# Patient Record
Sex: Female | Born: 1940 | Race: White | Hispanic: No | Smoking: Former smoker
Health system: Southern US, Community
[De-identification: ages and names within clinical notes are randomized; demographics above are authoritative.]

## PROBLEM LIST (undated history)

## (undated) DIAGNOSIS — K219 Gastro-esophageal reflux disease without esophagitis: Secondary | ICD-10-CM

## (undated) DIAGNOSIS — F419 Anxiety disorder, unspecified: Secondary | ICD-10-CM

## (undated) DIAGNOSIS — E785 Hyperlipidemia, unspecified: Secondary | ICD-10-CM

## (undated) HISTORY — PX: OTHER SURGICAL HISTORY: SHX169

## (undated) HISTORY — DX: Hyperlipidemia, unspecified: E78.5

---
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
## 1969-06-18 HISTORY — PX: URETHRA SURGERY: SHX824

## 1984-06-18 HISTORY — PX: GANGLION CYST EXCISION: SHX1691

## 1988-06-18 HISTORY — PX: IRRIGATION AND DEBRIDEMENT SEBACEOUS CYST: SHX5255

## 2002-11-03 DIAGNOSIS — F4320 Adjustment disorder, unspecified: Secondary | ICD-10-CM | POA: Insufficient documentation

## 2002-11-03 DIAGNOSIS — F988 Other specified behavioral and emotional disorders with onset usually occurring in childhood and adolescence: Secondary | ICD-10-CM | POA: Insufficient documentation

## 2004-05-23 ENCOUNTER — Ambulatory Visit: Payer: Self-pay | Admitting: Family Medicine

## 2005-05-31 ENCOUNTER — Ambulatory Visit: Payer: Self-pay | Admitting: Family Medicine

## 2006-09-04 ENCOUNTER — Ambulatory Visit: Payer: Self-pay | Admitting: Family Medicine

## 2006-09-24 ENCOUNTER — Ambulatory Visit: Payer: Self-pay | Admitting: Unknown Physician Specialty

## 2007-09-11 ENCOUNTER — Ambulatory Visit: Payer: Self-pay | Admitting: Family Medicine

## 2008-11-12 DIAGNOSIS — E7800 Pure hypercholesterolemia, unspecified: Secondary | ICD-10-CM | POA: Insufficient documentation

## 2008-11-12 DIAGNOSIS — E785 Hyperlipidemia, unspecified: Secondary | ICD-10-CM | POA: Insufficient documentation

## 2008-11-12 DIAGNOSIS — K2100 Gastro-esophageal reflux disease with esophagitis, without bleeding: Secondary | ICD-10-CM | POA: Insufficient documentation

## 2008-11-12 DIAGNOSIS — Z720 Tobacco use: Secondary | ICD-10-CM | POA: Insufficient documentation

## 2009-01-26 ENCOUNTER — Ambulatory Visit: Payer: Self-pay | Admitting: Family Medicine

## 2009-05-02 ENCOUNTER — Ambulatory Visit: Payer: Self-pay | Admitting: Urology

## 2009-07-08 DIAGNOSIS — R42 Dizziness and giddiness: Secondary | ICD-10-CM | POA: Insufficient documentation

## 2010-05-02 ENCOUNTER — Ambulatory Visit: Payer: Self-pay | Admitting: Family Medicine

## 2011-04-23 LAB — HM PAP SMEAR: HM Pap smear: NEGATIVE

## 2011-06-27 ENCOUNTER — Ambulatory Visit: Payer: Self-pay | Admitting: Family Medicine

## 2012-03-06 ENCOUNTER — Ambulatory Visit: Payer: Self-pay | Admitting: Unknown Physician Specialty

## 2012-03-06 LAB — HM COLONOSCOPY

## 2013-08-10 LAB — CBC AND DIFFERENTIAL
HEMATOCRIT: 38 % (ref 36–46)
HEMOGLOBIN: 12.7 g/dL (ref 12.0–16.0)
Platelets: 259 10*3/uL (ref 150–399)
WBC: 5.5 10^3/mL

## 2013-08-10 LAB — HEPATIC FUNCTION PANEL
ALT: 19 U/L (ref 7–35)
AST: 21 U/L (ref 13–35)

## 2013-08-10 LAB — BASIC METABOLIC PANEL
BUN: 15 mg/dL (ref 4–21)
Creatinine: 0.8 mg/dL (ref 0.5–1.1)
Glucose: 94 mg/dL
Potassium: 4.3 mmol/L (ref 3.4–5.3)
SODIUM: 143 mmol/L (ref 137–147)

## 2013-08-10 LAB — TSH: TSH: 2.17 u[IU]/mL (ref 0.41–5.90)

## 2013-08-10 LAB — LIPID PANEL
Cholesterol: 259 mg/dL — AB (ref 0–200)
HDL: 63 mg/dL (ref 35–70)
LDL Cholesterol: 169 mg/dL
Triglycerides: 137 mg/dL (ref 40–160)

## 2013-08-25 ENCOUNTER — Ambulatory Visit: Payer: Self-pay | Admitting: Family Medicine

## 2013-08-26 ENCOUNTER — Ambulatory Visit: Payer: Self-pay | Admitting: Family Medicine

## 2013-08-26 LAB — HM DEXA SCAN

## 2014-09-20 ENCOUNTER — Ambulatory Visit
Admit: 2014-09-20 | Disposition: A | Discharge status: Home / Self Care | Payer: Self-pay | Attending: Family Medicine | Admitting: Family Medicine

## 2014-09-20 LAB — HM MAMMOGRAPHY

## 2015-06-10 DIAGNOSIS — L259 Unspecified contact dermatitis, unspecified cause: Secondary | ICD-10-CM | POA: Insufficient documentation

## 2015-06-10 DIAGNOSIS — R319 Hematuria, unspecified: Secondary | ICD-10-CM | POA: Insufficient documentation

## 2015-06-10 DIAGNOSIS — M8580 Other specified disorders of bone density and structure, unspecified site: Secondary | ICD-10-CM | POA: Insufficient documentation

## 2015-06-14 ENCOUNTER — Telehealth: Payer: Self-pay | Admitting: Family Medicine

## 2015-06-14 NOTE — Telephone Encounter (Signed)
Needs ov to check urine. Thanks.

## 2015-06-14 NOTE — Telephone Encounter (Signed)
Pt scheduled for 06/15/2015. sd

## 2015-06-14 NOTE — Telephone Encounter (Signed)
Pt states she is having cloudy urine, burning when voiding and frequency.  Pt states this started yesterday.  Pt is requesting a Rx to help with this.  Pt has an appointment on Friday for CPE and did not want to come in 2 times this week.  [facility] [address]  CB#[phone]/MW

## 2015-06-15 ENCOUNTER — Encounter: Payer: Self-pay | Admitting: Family Medicine

## 2015-06-15 ENCOUNTER — Ambulatory Visit (INDEPENDENT_AMBULATORY_CARE_PROVIDER_SITE_OTHER): Payer: Medicare Other | Admitting: Family Medicine

## 2015-06-15 VITALS — BP 132/74 | HR 88 | Temp 98.2°F | Resp 16 | Ht 66.0 in | Wt 156.0 lb

## 2015-06-15 DIAGNOSIS — N3090 Cystitis, unspecified without hematuria: Secondary | ICD-10-CM | POA: Diagnosis not present

## 2015-06-15 LAB — POCT URINALYSIS DIPSTICK
GLUCOSE UA: NEGATIVE
Ketones, UA: NEGATIVE
NITRITE UA: NEGATIVE
PH UA: 6
PROTEIN UA: NEGATIVE
Spec Grav, UA: 1.02
UROBILINOGEN UA: 0.2

## 2015-06-15 MED ORDER — CIPROFLOXACIN HCL 250 MG PO TABS: 250.0000 mg | ORAL_TABLET | Freq: Two times a day (BID) | ORAL | Status: DC

## 2015-06-15 NOTE — Progress Notes (Signed)
Subjective:    Patient ID: Tasha Wallace, female    DOB: 03-04-1941, 74 y.o.   MRN: [id number]  Urinary Tract Infection  This is a new problem. Episode onset: x 4 days. The problem occurs every urination. The problem has been gradually improving. The quality of the pain is described as burning. The pain is at a severity of 0/10. The patient is experiencing no pain. There has been no fever. There is no history of pyelonephritis. Associated symptoms include frequency, hematuria, hesitancy and urgency. Pertinent negatives include no chills, discharge, flank pain, nausea, possible pregnancy, sweats or vomiting. Treatments tried: AZO and Cystex.  The treatment provided mild (Symptoms are some better today.  ) relief. There is no history of catheterization or recurrent UTIs.      Review of Systems  Constitutional: Negative for chills.  Gastrointestinal: Negative for nausea and vomiting.  Genitourinary: Positive for hesitancy, urgency, frequency and hematuria. Negative for flank pain, decreased urine volume, vaginal discharge and pelvic pain.   BP 132/74 mmHg  Pulse 88  Temp(Src) 98.2 F (36.8 C) (Oral)  Resp 16  Ht 5\' 6"  (1.676 m)  Wt 156 lb (70.761 kg)  BMI 25.19 kg/m2   Patient Active Problem List   Diagnosis Date Noted  . CD (contact dermatitis) 06/10/2015  . Blood in the urine 06/10/2015  . Osteopenia 06/10/2015  . Dizziness and giddiness 07/08/2009  . Esophagitis, reflux 11/12/2008  . Hypercholesteremia 11/12/2008  . HLD (hyperlipidemia) 11/12/2008  . Current tobacco use 11/12/2008  . ADD (attention deficit disorder) 11/03/2002  . Adaptation reaction 11/03/2002   No past medical history on file. Current Outpatient Prescriptions on File Prior to Visit  Medication Sig  . Omeprazole 20 MG TBEC Take 1 tablet by mouth daily.  . simvastatin (ZOCOR) 10 MG tablet Take 1 tablet by mouth daily. Reported on 06/15/2015   No current facility-administered medications on file prior to  visit.   Allergies  Allergen Reactions  . Metronidazole Rash   Past Surgical History  Procedure Laterality Date  . Irrigation and debridement sebaceous cyst  1990  . Ganglion cyst excision  1986  . Urethra surgery  1971  . Tonsillectomy and adenoidectomy  1950   Social History   Social History  . Marital Status: Divorced    Spouse Name: N/A  . Number of Children: N/A  . Years of Education: N/A   Occupational History  . Not on file.   Social History Main Topics  . Smoking status: Former Smoker    Quit date: 06/17/2012  . Smokeless tobacco: Never Used  . Alcohol Use: Yes     Comment: occasional  . Drug Use: No  . Sexual Activity: Not on file   Other Topics Concern  . Not on file   Social History Narrative   Family History  Problem Relation Age of Onset  . Hypertension Mother   . CVA Mother   . Heart disease Father       Objective:   Physical Exam  Constitutional: She appears well-developed and well-nourished. No distress.  Cardiovascular: Normal rate.   Pulmonary/Chest: Breath sounds normal.  Abdominal: She exhibits no distension and no mass. There is tenderness in the suprapubic area. There is no rebound, no guarding and no CVA tenderness.   BP 132/74 mmHg  Pulse 88  Temp(Src) 98.2 F (36.8 C) (Oral)  Resp 16  Ht 5\' 6"  (1.676 m)  Wt 156 lb (70.761 kg)  BMI 25.19 kg/m2  Assessment & Plan:  1. Cystitis New problem. Will send in culture.  Will treat. Follow up at [facility] on Friday.  Patient instructed to call back if condition worsens or does not improve.    - POCT urinalysis dipstick - Urine culture - ciprofloxacin (CIPRO) 250 MG tablet; Take 1 tablet (250 mg total) by mouth 2 (two) times daily.  Dispense: 6 tablet; Refill: 0   Lorie Phenix, MD

## 2015-06-16 LAB — URINE CULTURE: Organism ID, Bacteria: NO GROWTH

## 2015-06-17 ENCOUNTER — Encounter: Payer: Self-pay | Admitting: Family Medicine

## 2015-06-17 ENCOUNTER — Ambulatory Visit (INDEPENDENT_AMBULATORY_CARE_PROVIDER_SITE_OTHER): Payer: Medicare Other | Admitting: Family Medicine

## 2015-06-17 VITALS — BP 122/82 | HR 76 | Temp 97.7°F | Resp 16 | Ht 66.0 in | Wt 157.0 lb

## 2015-06-17 DIAGNOSIS — Z0000 Encounter for general adult medical examination without abnormal findings: Secondary | ICD-10-CM

## 2015-06-17 DIAGNOSIS — E7800 Pure hypercholesterolemia, unspecified: Secondary | ICD-10-CM | POA: Diagnosis not present

## 2015-06-17 DIAGNOSIS — N3090 Cystitis, unspecified without hematuria: Secondary | ICD-10-CM | POA: Diagnosis not present

## 2015-06-17 LAB — POCT URINALYSIS DIPSTICK
Bilirubin, UA: NEGATIVE
Blood, UA: NEGATIVE
Glucose, UA: NEGATIVE
KETONES UA: NEGATIVE
LEUKOCYTES UA: NEGATIVE
Nitrite, UA: NEGATIVE
PH UA: 6
PROTEIN UA: NEGATIVE
Spec Grav, UA: 1.025
UROBILINOGEN UA: 0.2

## 2015-06-17 NOTE — Progress Notes (Signed)
Patient ID: [name], female   DOB: [birth year], 74 y.o.   MRN: [id number]        Patient: Tasha Wallace, Female    DOB: 1941-03-05, 74 y.o.   MRN: [id number] Visit Date: 06/17/2015  Today's Provider: Lorie Phenix, MD   Chief Complaint  Patient presents with  . Medicare Wellness  . Follow-up    painful urination   Subjective:    Annual wellness visit Tasha Wallace is a 74 y.o. female. She feels well. She reports exercising none. She reports she is sleeping well. Urine improved. Still not smoking. No health concerns today.    09/20/14 Mammogram-BI-RADS 1 03/06/12 Colonoscopy-polyp, recheck in 10 yrs 08/26/13 BMD-osteopenia  Lab Results  Component Value Date   WBC 5.5 08/10/2013   HGB 12.7 08/10/2013   HCT 38 08/10/2013   PLT 259 08/10/2013   CHOL 259* 08/10/2013   TRIG 137 08/10/2013   HDL 63 08/10/2013   LDLCALC 169 08/10/2013   ALT 19 08/10/2013   AST 21 08/10/2013   NA 143 08/10/2013   K 4.3 08/10/2013   CREATININE 0.8 08/10/2013   BUN 15 08/10/2013   TSH 2.17 08/10/2013   -----------------------------------------------------------  Review of Systems  Constitutional: Negative.   HENT: Positive for tinnitus.   Eyes: Negative.   Respiratory: Negative.   Cardiovascular: Negative.   Gastrointestinal: Negative.   Endocrine: Positive for cold intolerance.  Genitourinary: Negative.   Musculoskeletal: Positive for arthralgias.  Skin: Negative.   Allergic/Immunologic: Negative.   Neurological: Negative.   Hematological: Negative.   Psychiatric/Behavioral: Negative.     Social History   Social History  . Marital Status: Divorced    Spouse Name: N/A  . Number of Children: N/A  . Years of Education: N/A   Occupational History  . Not on file.   Social History Main Topics  . Smoking status: Former Smoker    Quit date: 06/17/2012  . Smokeless tobacco: Never Used  . Alcohol Use: 3.6 oz/week    6 Glasses of wine per week     Comment: occasional  . Drug  Use: No  . Sexual Activity: Not on file   Other Topics Concern  . Not on file   Social History Narrative    History reviewed. No pertinent past medical history.   Patient Active Problem List   Diagnosis Date Noted  . Cystitis 06/15/2015  . CD (contact dermatitis) 06/10/2015  . Blood in the urine 06/10/2015  . Osteopenia 06/10/2015  . Dizziness and giddiness 07/08/2009  . Esophagitis, reflux 11/12/2008  . Hypercholesteremia 11/12/2008  . HLD (hyperlipidemia) 11/12/2008  . Current tobacco use 11/12/2008  . ADD (attention deficit disorder) 11/03/2002  . Adaptation reaction 11/03/2002    Past Surgical History  Procedure Laterality Date  . Irrigation and debridement sebaceous cyst  1990  . Ganglion cyst excision  1986  . Urethra surgery  1971  . Tonsillectomy and adenoidectomy  1950    Her family history includes CVA in her mother; Heart disease in her father; Hypertension in her mother.    Previous Medications   CIPROFLOXACIN (CIPRO) 250 MG TABLET    Take 1 tablet (250 mg total) by mouth 2 (two) times daily.   OMEPRAZOLE 20 MG TBEC    Take 1 tablet by mouth daily.   SIMVASTATIN (ZOCOR) 10 MG TABLET    Take 1 tablet by mouth. Reported on 06/15/2015    Patient Care Team: Lorie Phenix, MD as PCP - General (Family Medicine)  Objective:   Vitals: BP 122/82 mmHg  Pulse 76  Temp(Src) 97.7 F (36.5 C) (Oral)  Resp 16  Ht 5\' 6"  (1.676 m)  Wt 157 lb (71.215 kg)  BMI 25.35 kg/m2  SpO2 98%  Physical Exam  Constitutional: She is oriented to person, place, and time. She appears well-developed and well-nourished.  HENT:  Head: Normocephalic and atraumatic.  Right Ear: Tympanic membrane, external ear and ear canal normal.  Left Ear: Tympanic membrane, external ear and ear canal normal.  Nose: Nose normal.  Mouth/Throat: Uvula is midline, oropharynx is clear and moist and mucous membranes are normal.  Eyes: Conjunctivae, EOM and lids are normal. Pupils are equal,  round, and reactive to light.  Neck: Trachea normal and normal range of motion. Neck supple. Carotid bruit is not present. No thyroid mass and no thyromegaly present.  Cardiovascular: Normal rate, regular rhythm and normal heart sounds.   Pulmonary/Chest: Effort normal and breath sounds normal.  Abdominal: Soft. Normal appearance and bowel sounds are normal. There is no hepatosplenomegaly. There is no tenderness.  Musculoskeletal: Normal range of motion.  Lymphadenopathy:    She has no cervical adenopathy.    She has no axillary adenopathy.  Neurological: She is alert and oriented to person, place, and time. She has normal strength. No cranial nerve deficit.  Skin: Skin is warm, dry and intact.  Psychiatric: She has a normal mood and affect. Her speech is normal and behavior is normal. Judgment and thought content normal. Cognition and memory are normal.    Activities of Daily Living In your present state of health, do you have any difficulty performing the following activities: 06/17/2015  Hearing? Y  Vision? N  Difficulty concentrating or making decisions? N  Walking or climbing stairs? N  Dressing or bathing? N  Doing errands, shopping? N    Fall Risk Assessment Fall Risk  06/17/2015  Falls in the past year? No     Depression Screen PHQ 2/9 Scores 06/17/2015  PHQ - 2 Score 0    Cognitive Testing - 6-CIT  Correct? Score   What year is it? yes 0 0 or 4  What month is it? yes 0 0 or 3  Memorize:    Floyde ParkinsJohn,  [place],  43,  [address],  [place],      What time is it? (within 1 hour) yes 0 0 or 3  Count backwards from 20 yes 0 0, 2, or 4  Name the months of the year yes 0 0, 2, or 4  Repeat name & address above yes 0 0, 2, 4, 6, 8, or 10       TOTAL SCORE  0/28   Interpretation:  Normal  Normal (0-7) Abnormal (8-28)       Assessment & Plan:     Annual Wellness Visit  Reviewed patient's Family Medical History Reviewed and updated list of patient's medical  providers Assessment of cognitive impairment was done Assessed patient's functional ability Established a written schedule for health screening services Health Risk Assessent Completed and Reviewed  Exercise Activities and Dietary recommendations Goals    . Exercise 150 minutes per week (moderate activity)       Immunization History  Administered Date(s) Administered  . Pneumococcal Conjugate-13 07/31/2013  . Pneumococcal Polysaccharide-23 07/08/2009  . Td 05/18/2004, 07/31/2013  . Tdap 07/31/2013  . Zoster 01/13/2009    1. Medicare annual wellness visit, subsequent As above. Stressed importance of increased exercise.    2. Cystitis Resolved. Call if  recurs.  - POCT urinalysis dipstick Results for orders placed or performed in visit on 06/17/15  POCT urinalysis dipstick  Result Value Ref Range   Color, UA yellow    Clarity, UA clear    Glucose, UA neg    Bilirubin, UA neg    Ketones, UA neg    Spec Grav, UA 1.025    Blood, UA neg    pH, UA 6.0    Protein, UA neg    Urobilinogen, UA 0.2    Nitrite, UA neg    Leukocytes, UA Negative Negative    3. Hypercholesteremia Off medication. Will check labs.   - CBC with Differential/Platelet - Comprehensive metabolic panel - Lipid Panel With LDL/HDL Ratio   Patient was seen and examined by Leo Grosser, MD, and note scribed by Rondel Baton, CMA.   I have reviewed the document for accuracy and completeness and I agree with above. Leo Grosser, MD   Lorie Phenix, MD    ------------------------------------------------------------------------------------------------------------

## 2015-07-19 ENCOUNTER — Telehealth: Payer: Self-pay

## 2015-07-19 ENCOUNTER — Other Ambulatory Visit: Payer: Self-pay | Admitting: Family Medicine

## 2015-07-19 DIAGNOSIS — E7800 Pure hypercholesterolemia, unspecified: Secondary | ICD-10-CM

## 2015-07-19 LAB — CBC WITH DIFFERENTIAL/PLATELET
BASOS: 0 %
Basophils Absolute: 0 10*3/uL (ref 0.0–0.2)
EOS (ABSOLUTE): 0.1 10*3/uL (ref 0.0–0.4)
EOS: 1 %
Hematocrit: 40.2 % (ref 34.0–46.6)
Hemoglobin: 13.5 g/dL (ref 11.1–15.9)
IMMATURE GRANS (ABS): 0 10*3/uL (ref 0.0–0.1)
IMMATURE GRANULOCYTES: 0 %
LYMPHS: 28 %
Lymphocytes Absolute: 1.6 10*3/uL (ref 0.7–3.1)
MCH: 31 pg (ref 26.6–33.0)
MCHC: 33.6 g/dL (ref 31.5–35.7)
MCV: 92 fL (ref 79–97)
Monocytes Absolute: 0.5 10*3/uL (ref 0.1–0.9)
Monocytes: 10 %
NEUTROS ABS: 3.5 10*3/uL (ref 1.4–7.0)
Neutrophils: 61 %
PLATELETS: 274 10*3/uL (ref 150–379)
RBC: 4.35 x10E6/uL (ref 3.77–5.28)
RDW: 13.8 % (ref 12.3–15.4)
WBC: 5.7 10*3/uL (ref 3.4–10.8)

## 2015-07-19 LAB — COMPREHENSIVE METABOLIC PANEL
A/G RATIO: 1.4 (ref 1.1–2.5)
ALT: 14 IU/L (ref 0–32)
AST: 15 IU/L (ref 0–40)
Albumin: 4.1 g/dL (ref 3.5–4.8)
Alkaline Phosphatase: 85 IU/L (ref 39–117)
BUN/Creatinine Ratio: 13 (ref 11–26)
BUN: 11 mg/dL (ref 8–27)
Bilirubin Total: 0.2 mg/dL (ref 0.0–1.2)
CALCIUM: 9.5 mg/dL (ref 8.7–10.3)
CO2: 24 mmol/L (ref 18–29)
CREATININE: 0.86 mg/dL (ref 0.57–1.00)
Chloride: 103 mmol/L (ref 96–106)
GFR calc Af Amer: 78 mL/min/{1.73_m2} (ref >59)
GFR, EST NON AFRICAN AMERICAN: 67 mL/min/{1.73_m2} (ref >59)
GLUCOSE: 101 mg/dL — AB (ref 65–99)
Globulin, Total: 3 g/dL (ref 1.5–4.5)
POTASSIUM: 4.4 mmol/L (ref 3.5–5.2)
Sodium: 142 mmol/L (ref 134–144)
Total Protein: 7.1 g/dL (ref 6.0–8.5)

## 2015-07-19 LAB — LIPID PANEL WITH LDL/HDL RATIO
Cholesterol, Total: 295 mg/dL — ABNORMAL HIGH (ref 100–199)
HDL: 58 mg/dL (ref >39)
LDL Calculated: 199 mg/dL — ABNORMAL HIGH (ref 0–99)
LDl/HDL Ratio: 3.4 ratio units — ABNORMAL HIGH (ref 0.0–3.2)
TRIGLYCERIDES: 192 mg/dL — AB (ref 0–149)
VLDL CHOLESTEROL CAL: 38 mg/dL (ref 5–40)

## 2015-07-19 MED ORDER — SIMVASTATIN 10 MG PO TABS: 10.0000 mg | ORAL_TABLET | Freq: Every day | ORAL | Status: DC

## 2015-07-19 NOTE — Telephone Encounter (Signed)
Pt contacted office for refill request on the following medications:  simvastatin (ZOCOR) 10 MG tablet.  [facility]  CB#[phone].MW

## 2015-07-19 NOTE — Telephone Encounter (Signed)
Have patient call  For lab slip to recheck labs in 3 months. Thanks.

## 2015-07-19 NOTE — Telephone Encounter (Signed)
Patient advised as below. Patient reports she has not taken simvastatin for about 1 year. Patient reports she will start taking her med every other day. sd

## 2015-07-19 NOTE — Telephone Encounter (Signed)
-----   Message from Lorie Phenix, MD sent at 07/19/2015  7:19 AM EST ----- Labs stable except for very high cholesterol and borderline blood sugar.  Recommend eat healthy and exercise and recheck labs in 6 months.  Also,  Cholesterol is much higher than would like it. Please clarify if patient taking her medication. Thanks.

## 2015-07-19 NOTE — Telephone Encounter (Signed)
Informed pt as below. Tasha Wallace, CMA  

## 2015-10-14 ENCOUNTER — Emergency Department (HOSPITAL_COMMUNITY): Payer: Medicare Other

## 2015-10-14 ENCOUNTER — Encounter (HOSPITAL_COMMUNITY): Payer: Self-pay

## 2015-10-14 ENCOUNTER — Emergency Department (HOSPITAL_COMMUNITY)
Admission: EM | Admit: 2015-10-14 | Discharge: 2015-10-14 | Disposition: A | Discharge status: Home / Self Care | Payer: Medicare Other | Attending: Emergency Medicine | Admitting: Emergency Medicine

## 2015-10-14 DIAGNOSIS — S0101XA Laceration without foreign body of scalp, initial encounter: Secondary | ICD-10-CM | POA: Insufficient documentation

## 2015-10-14 DIAGNOSIS — T07XXXA Unspecified multiple injuries, initial encounter: Secondary | ICD-10-CM

## 2015-10-14 DIAGNOSIS — S0990XA Unspecified injury of head, initial encounter: Secondary | ICD-10-CM | POA: Diagnosis present

## 2015-10-14 DIAGNOSIS — Z87891 Personal history of nicotine dependence: Secondary | ICD-10-CM | POA: Diagnosis not present

## 2015-10-14 DIAGNOSIS — Z792 Long term (current) use of antibiotics: Secondary | ICD-10-CM | POA: Insufficient documentation

## 2015-10-14 DIAGNOSIS — Y998 Other external cause status: Secondary | ICD-10-CM | POA: Insufficient documentation

## 2015-10-14 DIAGNOSIS — S39012A Strain of muscle, fascia and tendon of lower back, initial encounter: Secondary | ICD-10-CM

## 2015-10-14 DIAGNOSIS — Z79899 Other long term (current) drug therapy: Secondary | ICD-10-CM | POA: Diagnosis not present

## 2015-10-14 DIAGNOSIS — S8002XA Contusion of left knee, initial encounter: Secondary | ICD-10-CM | POA: Diagnosis not present

## 2015-10-14 DIAGNOSIS — S8001XA Contusion of right knee, initial encounter: Secondary | ICD-10-CM | POA: Diagnosis not present

## 2015-10-14 DIAGNOSIS — S40021A Contusion of right upper arm, initial encounter: Secondary | ICD-10-CM | POA: Insufficient documentation

## 2015-10-14 DIAGNOSIS — S40022A Contusion of left upper arm, initial encounter: Secondary | ICD-10-CM | POA: Diagnosis not present

## 2015-10-14 DIAGNOSIS — Y9389 Activity, other specified: Secondary | ICD-10-CM | POA: Diagnosis not present

## 2015-10-14 DIAGNOSIS — Y92410 Unspecified street and highway as the place of occurrence of the external cause: Secondary | ICD-10-CM | POA: Insufficient documentation

## 2015-10-14 DIAGNOSIS — Z23 Encounter for immunization: Secondary | ICD-10-CM | POA: Diagnosis not present

## 2015-10-14 MED ORDER — HYDROCODONE-ACETAMINOPHEN 5-325 MG PO TABS: 1.0000 | ORAL_TABLET | Freq: Once | ORAL | Status: DC

## 2015-10-14 MED ORDER — DIAZEPAM 5 MG PO TABS: 5.0000 mg | ORAL_TABLET | Freq: Two times a day (BID) | ORAL | Status: DC

## 2015-10-14 MED ORDER — LIDOCAINE-EPINEPHRINE (PF) 2 %-1:200000 IJ SOLN
20.0000 mL | Freq: Once | INTRAMUSCULAR | Status: AC
  Administered 2015-10-14: 20 mL
  Filled 2015-10-14: qty 20

## 2015-10-14 MED ORDER — HYDROCODONE-ACETAMINOPHEN 5-325 MG PO TABS
1.0000 | ORAL_TABLET | Freq: Once | ORAL | Status: AC
  Administered 2015-10-14: 1 via ORAL
  Filled 2015-10-14: qty 1

## 2015-10-14 MED ORDER — TETANUS-DIPHTH-ACELL PERTUSSIS 5-2.5-18.5 LF-MCG/0.5 IM SUSP
0.5000 mL | Freq: Once | INTRAMUSCULAR | Status: AC
  Administered 2015-10-14: 0.5 mL via INTRAMUSCULAR
  Filled 2015-10-14: qty 0.5

## 2015-10-14 NOTE — Discharge Instructions (Signed)
Head Injury, Adult °You have a head injury. Headaches and throwing up (vomiting) are common after a head injury. It should be easy to wake up from sleeping. Sometimes you must stay in the hospital. Most problems happen within the first 24 hours. Side effects may occur up to 7-10 days after the injury.  °WHAT ARE THE TYPES OF HEAD INJURIES? °Head injuries can be as minor as a bump. Some head injuries can be more severe. More severe head injuries include: °· A jarring injury to the brain (concussion). °· A bruise of the brain (contusion). This mean there is bleeding in the brain that can cause swelling. °· A cracked skull (skull fracture). °· Bleeding in the brain that collects, clots, and forms a bump (hematoma). °WHEN SHOULD I GET HELP RIGHT AWAY?  °· You are confused or sleepy. °· You cannot be woken up. °· You feel sick to your stomach (nauseous) or keep throwing up (vomiting). °· Your dizziness or unsteadiness is getting worse. °· You have very bad, lasting headaches that are not helped by medicine. Take medicines only as told by your doctor. °· You cannot use your arms or legs like normal. °· You cannot walk. °· You notice changes in the black spots in the center of the colored part of your eye (pupil). °· You have clear or bloody fluid coming from your nose or ears. °· You have trouble seeing. °During the next 24 hours after the injury, you must stay with someone who can watch you. This person should get help right away (call 911 in the [place] if you start to shake and are not able to control it (have seizures), you pass out, or you are unable to wake up. °HOW CAN I PREVENT A HEAD INJURY IN THE FUTURE? °· Wear seat belts. °· Wear a helmet while bike riding and playing sports like football. °· Stay away from dangerous activities around the house. °WHEN CAN I RETURN TO NORMAL ACTIVITIES AND ATHLETICS? °See your doctor before doing these activities. You should not do normal activities or play contact sports until 1  week after the following symptoms have stopped: °· Headache that does not go away. °· Dizziness. °· Poor attention. °· Confusion. °· Memory problems. °· Sickness to your stomach or throwing up. °· Tiredness. °· Fussiness. °· Bothered by bright lights or loud noises. °· Anxiousness or depression. °· Restless sleep. °MAKE SURE YOU:  °· Understand these instructions. °· Will watch your condition. °· Will get help right away if you are not doing well or get worse. °  °This information is not intended to replace advice given to you by your health care provider. Make sure you discuss any questions you have with your health care provider. °  °Document Released: 05/17/2008 Document Revised: 06/25/2014 Document Reviewed: 02/09/2013 °Elsevier Interactive Patient Education ©2016 [employer] ° °
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
## 2015-10-14 NOTE — ED Provider Notes (Signed)
LACERATION REPAIR Performed by: Langston MaskerSOFIA,KAREN Authorized by: Langston MaskerSOFIA,KAREN Consent: Verbal consent obtained. Risks and benefits: risks, benefits and alternatives were discussed Consent given by: patient Patient identity confirmed: provided demographic data Prepped and Draped in normal sterile fashion Wound explored  Laceration Location: scalp  Laceration Length:18cm  No Foreign Bodies seen or palpated  Anesthesia: local infiltration   Local anesthetic: lidocaine 1%  Anesthetic total: 12 ml  Irrigation method: syringe Amount of cleaning: standard  Skin closure: local  Number of sutures: 3  sutures13 staples  Technique: simple  Patient tolerance: Patient tolerated the procedure well with no immediate complications.  Tasha SkinnerLeslie K ToppersSofia, PA-C 10/14/15 1918

## 2015-10-17 ENCOUNTER — Telehealth: Payer: Self-pay | Admitting: Family Medicine

## 2015-10-17 NOTE — Telephone Encounter (Signed)
Spoke with pt and moved appt to 12 pm. Thanks TNP

## 2015-10-17 NOTE — Telephone Encounter (Signed)
Ok to schedule. Thanks

## 2015-10-17 NOTE — Telephone Encounter (Signed)
Pt is scheduled for hospital visit on 10/24/15 at 945 to get staples removed from head and follow up. Pt would like to come in later in the day b/c she is so sore it is hard for her to get up and going that early. Pt stated she would have to get someone to bring her to the office. Can I move pt to one of the same day slots on 10/24/15? Please advise. Thanks TNP

## 2015-10-24 ENCOUNTER — Ambulatory Visit (INDEPENDENT_AMBULATORY_CARE_PROVIDER_SITE_OTHER): Payer: Medicare Other | Admitting: Family Medicine

## 2015-10-24 ENCOUNTER — Encounter: Payer: Self-pay | Admitting: Family Medicine

## 2015-10-24 VITALS — BP 126/72 | HR 80 | Temp 97.8°F | Resp 16 | Wt 158.0 lb

## 2015-10-24 DIAGNOSIS — M25561 Pain in right knee: Secondary | ICD-10-CM

## 2015-10-24 DIAGNOSIS — S0191XS Laceration without foreign body of unspecified part of head, sequela: Secondary | ICD-10-CM

## 2015-10-24 DIAGNOSIS — S0990XS Unspecified injury of head, sequela: Secondary | ICD-10-CM
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
## 2015-10-26 DIAGNOSIS — M25561 Pain in right knee: Secondary | ICD-10-CM | POA: Insufficient documentation

## 2015-10-27 ENCOUNTER — Other Ambulatory Visit (HOSPITAL_COMMUNITY): Payer: Self-pay | Admitting: Unknown Physician Specialty

## 2015-10-27 DIAGNOSIS — M25561 Pain in right knee: Secondary | ICD-10-CM

## 2015-10-28 ENCOUNTER — Ambulatory Visit
Admission: RE | Admit: 2015-10-28 | Discharge: 2015-10-28 | Disposition: A | Discharge status: Home / Self Care | Payer: Medicare Other | Source: Ambulatory Visit | Attending: Unknown Physician Specialty | Admitting: Unknown Physician Specialty

## 2015-10-28 DIAGNOSIS — S82831A Other fracture of upper and lower end of right fibula, initial encounter for closed fracture: Secondary | ICD-10-CM | POA: Diagnosis not present

## 2015-10-28 DIAGNOSIS — S838X1A Sprain of other specified parts of right knee, initial encounter: Secondary | ICD-10-CM | POA: Insufficient documentation

## 2015-10-28 DIAGNOSIS — M25561 Pain in right knee: Secondary | ICD-10-CM | POA: Diagnosis present

## 2015-11-02 DIAGNOSIS — S82831D Other fracture of upper and lower end of right fibula, subsequent encounter for closed fracture with routine healing: Secondary | ICD-10-CM | POA: Insufficient documentation

## 2015-11-02 DIAGNOSIS — S83271A Complex tear of lateral meniscus, current injury, right knee, initial encounter: Secondary | ICD-10-CM | POA: Insufficient documentation

## 2015-11-02 DIAGNOSIS — S83411A Sprain of medial collateral ligament of right knee, initial encounter: Secondary | ICD-10-CM | POA: Insufficient documentation

## 2015-11-02 DIAGNOSIS — S83511A Sprain of anterior cruciate ligament of right knee, initial encounter: Secondary | ICD-10-CM | POA: Insufficient documentation

## 2015-11-02 DIAGNOSIS — S82141D Displaced bicondylar fracture of right tibia, subsequent encounter for closed fracture with routine healing: Secondary | ICD-10-CM | POA: Insufficient documentation

## 2016-03-01 ENCOUNTER — Other Ambulatory Visit: Payer: Self-pay | Admitting: Family Medicine

## 2016-04-14 ENCOUNTER — Other Ambulatory Visit: Payer: Self-pay | Admitting: Family Medicine

## 2016-04-14 DIAGNOSIS — E7800 Pure hypercholesterolemia, unspecified: Secondary | ICD-10-CM

## 2016-08-10 ENCOUNTER — Ambulatory Visit (INDEPENDENT_AMBULATORY_CARE_PROVIDER_SITE_OTHER): Payer: Medicare Other

## 2016-08-10 ENCOUNTER — Ambulatory Visit: Payer: Medicare Other | Admitting: Physician Assistant

## 2016-08-10 VITALS — BP 128/72 | HR 68 | Temp 97.9°F | Ht 66.0 in | Wt 159.4 lb

## 2016-08-10 DIAGNOSIS — M8580 Other specified disorders of bone density and structure, unspecified site: Secondary | ICD-10-CM | POA: Diagnosis not present

## 2016-08-10 DIAGNOSIS — E7800 Pure hypercholesterolemia, unspecified: Secondary | ICD-10-CM | POA: Diagnosis not present

## 2016-08-10 DIAGNOSIS — Z1231 Encounter for screening mammogram for malignant neoplasm of breast: Secondary | ICD-10-CM | POA: Diagnosis not present

## 2016-08-10 DIAGNOSIS — Z0000 Encounter for general adult medical examination without abnormal findings: Secondary | ICD-10-CM | POA: Diagnosis not present

## 2016-08-10 DIAGNOSIS — Z1239 Encounter for other screening for malignant neoplasm of breast: Secondary | ICD-10-CM

## 2016-08-10 DIAGNOSIS — Z780 Asymptomatic menopausal state: Secondary | ICD-10-CM | POA: Diagnosis not present

## 2016-08-10 NOTE — Patient Instructions (Signed)
Dyslipidemia Dyslipidemia is an imbalance of waxy, fat-like substances (lipids) in the blood. The body needs lipids in small amounts. Dyslipidemia often involves a high level of cholesterol or triglycerides, which are types of lipids. Common forms of dyslipidemia include:  High levels of bad cholesterol (LDL cholesterol). LDL is the type of cholesterol that causes fatty deposits (plaques) to build up in the blood vessels that carry blood away from your heart (arteries).  Low levels of good cholesterol (HDL cholesterol). HDL cholesterol is the type of cholesterol that protects against heart disease. High levels of HDL remove the LDL buildup from arteries.  High levels of triglycerides. Triglycerides are a fatty substance in the blood that is linked to a buildup of plaques in the arteries. You can develop dyslipidemia because of the genes you are born with (primary dyslipidemia) or changes that occur during your life (secondary dyslipidemia), or as a side effect of certain medical treatments. What are the causes? Primary dyslipidemia is caused by changes (mutations) in genes that are passed down through families (inherited). These mutations cause several types of dyslipidemia. Mutations can result in disorders that make the body produce too much LDL cholesterol or triglycerides, or not enough HDL cholesterol. These disorders may lead to heart disease, arterial disease, or stroke at an early age. Causes of secondary dyslipidemia include certain lifestyle choices and diseases that lead to dyslipidemia, such as:  Eating a diet that is high in animal fat.  Not getting enough activity or exercise (having a sedentary lifestyle).  Having diabetes, kidney disease, liver disease, or thyroid disease.  Drinking large amounts of alcohol.  Using certain types of drugs. What increases the risk? You may be at greater risk for dyslipidemia if you are an older man or if you are a woman who has gone through  menopause. Other risk factors include:  Having a family history of dyslipidemia.  Taking certain medicines, including birth control pills, steroids, some diuretics, beta-blockers, and some medicines forHIV.  Smoking cigarettes.  Eating a high-fat diet.  Drinking large amounts of alcohol.  Having certain medical conditions such as diabetes, polycystic ovary syndrome (PCOS), pregnancy, kidney disease, liver disease, or hypothyroidism.  Not exercising regularly.  Being overweight or obese with too much belly fat. What are the signs or symptoms? Dyslipidemia does not usually cause any symptoms. Very high lipid levels can cause fatty bumps under the skin (xanthomas) or a white or gray ring around the black center (pupil) of the eye. Very high triglyceride levels can cause inflammation of the pancreas (pancreatitis). How is this diagnosed? Your health care provider may diagnose dyslipidemia based on a routine blood test (fasting blood test). Because most people do not have symptoms of the condition, this blood testing (lipid profile) is done on adults age 20 and older and is repeated every 5 years. This test checks:  Total cholesterol. This is a measure of the total amount of cholesterol in your blood, including LDL cholesterol, HDL cholesterol, and triglycerides. A healthy number is below 200.  LDL cholesterol. The target number for LDL cholesterol is different for each person, depending on individual risk factors. For most people, a number below 100 is healthy. Ask your health care provider what your LDL cholesterol number should be.  HDL cholesterol. An HDL level of 60 or higher is best because it helps to protect against heart disease. A number below 40 for men or below 50 for women increases the risk for heart disease.  Triglycerides. A healthy triglyceride number   is below 150. If your lipid profile is abnormal, your health care provider may do other blood tests to get more information  about your condition. How is this treated? Treatment depends on the type of dyslipidemia that you have and your other risk factors for heart disease and stroke. Your health care provider will have a target range for your lipid levels based on this information. For many people, treatment starts with lifestyle changes, such as diet and exercise. Your health care provider may recommend that you:  Get regular exercise.  Make changes to your diet.  Quit smoking if you smoke. If diet changes and exercise do not help you reach your goals, your health care provider may also prescribe medicine to lower lipids. The most commonly prescribed type of medicine lowers your LDL cholesterol (statin drug). If you have a high triglyceride level, your provider may prescribe another type of drug (fibrate) or an omega-3 fish oil supplement, or both. Follow these instructions at home:  Take over-the-counter and prescription medicines only as told by your health care provider. This includes supplements.  Get regular exercise. Start an aerobic exercise and strength training program as told by your health care provider. Ask your health care provider what activities are safe for you. Your health care provider may recommend:  30 minutes of aerobic activity 4-6 days a week. Brisk walking is an example of aerobic activity.  Strength training 2 days a week.  Eat a healthy diet as told by your health care provider. This can help you reach and maintain a healthy weight, lower your LDL cholesterol, and raise your HDL cholesterol. It may help to work with a diet and nutrition specialist (dietitian) to make a plan that is right for you. Your dietitian or health care provider may recommend:  Limiting your calories, if you are overweight.  Eating more fruits, vegetables, whole grains, fish, and lean meats.  Limiting saturated fat, trans fat, and cholesterol.  Follow instructions from your health care provider or dietitian  about eating or drinking restrictions.  Limit alcohol intake to no more than one drink per day for nonpregnant women and two drinks per day for men. One drink equals 12 oz of beer, 5 oz of wine, or 1 oz of hard liquor.  Do not use any products that contain nicotine or tobacco, such as cigarettes and e-cigarettes. If you need help quitting, ask your health care provider.  Keep all follow-up visits as told by your health care provider. This is important. Contact a health care provider if:  You are having trouble sticking to your exercise or diet plan.  You are struggling to quit smoking or control your use of alcohol. Summary  Dyslipidemia is an imbalance of waxy, fat-like substances (lipids) in the blood. The body needs lipids in small amounts. Dyslipidemia often involves a high level of cholesterol or triglycerides, which are types of lipids.  Treatment depends on the type of dyslipidemia that you have and your other risk factors for heart disease and stroke.  For many people, treatment starts with lifestyle changes, such as diet and exercise. Your health care provider may also prescribe medicine to lower lipids. This information is not intended to replace advice given to you by your health care provider. Make sure you discuss any questions you have with your health care provider. Document Released: 06/09/2013 Document Revised: 01/30/2016 Document Reviewed: 01/30/2016 [employer] Interactive Patient Education  2017 [employer]  

## 2016-08-10 NOTE — Patient Instructions (Signed)
Health Maintenance, Female Introduction Adopting a healthy lifestyle and getting preventive care can go a long way to promote health and wellness. Talk with your health care provider about what schedule of regular examinations is right for you. This is a good chance for you to check in with your provider about disease prevention and staying healthy. In between checkups, there are plenty of things you can do on your own. Experts have done a lot of research about which lifestyle changes and preventive measures are most likely to keep you healthy. Ask your health care provider for more information. Weight and diet Eat a healthy diet  Be sure to include plenty of vegetables, fruits, low-fat dairy products, and lean protein.  Do not eat a lot of foods high in solid fats, added sugars, or salt.  Get regular exercise. This is one of the most important things you can do for your health.  Most adults should exercise for at least 150 minutes each week. The exercise should increase your heart rate and make you sweat (moderate-intensity exercise).  Most adults should also do strengthening exercises at least twice a week. This is in addition to the moderate-intensity exercise. Maintain a healthy weight  Body mass index (BMI) is a measurement that can be used to identify possible weight problems. It estimates body fat based on height and weight. Your health care provider can help determine your BMI and help you achieve or maintain a healthy weight.  For females 4 years of age and older:  A BMI below 18.5 is considered underweight.  A BMI of 18.5 to 24.9 is normal.  A BMI of 25 to 29.9 is considered overweight.  A BMI of 30 and above is considered obese. Watch levels of cholesterol and blood lipids  You should start having your blood tested for lipids and cholesterol at 75 years of age, then have this test every 5 years.  You may need to have your cholesterol levels checked more often  if:  Your lipid or cholesterol levels are high.  You are older than 75 years of age.  You are at high risk for heart disease. Cancer screening Lung Cancer  Lung cancer screening is recommended for adults 12-31 years old who are at high risk for lung cancer because of a history of smoking.  A yearly low-dose CT scan of the lungs is recommended for people who:  Currently smoke.  Have quit within the past 15 years.  Have at least a 30-pack-year history of smoking. A pack year is smoking an average of one pack of cigarettes a day for 1 year.  Yearly screening should continue until it has been 15 years since you quit.  Yearly screening should stop if you develop a health problem that would prevent you from having lung cancer treatment. Breast Cancer  Practice breast self-awareness. This means understanding how your breasts normally appear and feel.  It also means doing regular breast self-exams. Let your health care provider know about any changes, no matter how small.  If you are in your 20s or 30s, you should have a clinical breast exam (CBE) by a health care provider every 1-3 years as part of a regular health exam.  If you are 74 or older, have a CBE every year. Also consider having a breast X-ray (mammogram) every year.  If you have a family history of breast cancer, talk to your health care provider about genetic screening.  If you are at high risk for breast cancer,  talk to your health care provider about having an MRI and a mammogram every year.  Breast cancer gene (BRCA) assessment is recommended for women who have family members with BRCA-related cancers. BRCA-related cancers include:  Breast.  Ovarian.  Tubal.  Peritoneal cancers.  Results of the assessment will determine the need for genetic counseling and BRCA1 and BRCA2 testing. Colorectal Cancer  This type of cancer can be detected and often prevented.  Routine colorectal cancer screening usually begins  at 75 years of age and continues through 75 years of age.  Your health care provider may recommend screening at an earlier age if you have risk factors for colon cancer.  Your health care provider may also recommend using home test kits to check for hidden blood in the stool.  A small camera at the end of a tube can be used to examine your colon directly (sigmoidoscopy or colonoscopy). This is done to check for the earliest forms of colorectal cancer.  Routine screening usually begins at age 50.  Direct examination of the colon should be repeated every 5-10 years through 75 years of age. However, you may need to be screened more often if early forms of precancerous polyps or small growths are found. Skin Cancer  Check your skin from head to toe regularly.  Tell your health care provider about any new moles or changes in moles, especially if there is a change in a mole's shape or color.  Also tell your health care provider if you have a mole that is larger than the size of a pencil eraser.  Always use sunscreen. Apply sunscreen liberally and repeatedly throughout the day.  Protect yourself by wearing long sleeves, pants, a wide-brimmed hat, and sunglasses whenever you are outside. Heart disease, diabetes, and high blood pressure  High blood pressure causes heart disease and increases the risk of stroke. High blood pressure is more likely to develop in:  People who have blood pressure in the high end of the normal range (130-139/85-89 mm Hg).  People who are overweight or obese.  People who are African American.  If you are 18-39 years of age, have your blood pressure checked every 3-5 years. If you are 40 years of age or older, have your blood pressure checked every year. You should have your blood pressure measured twice-once when you are at a hospital or clinic, and once when you are not at a hospital or clinic. Record the average of the two measurements. To check your blood pressure  when you are not at a hospital or clinic, you can use:  An automated blood pressure machine at a pharmacy.  A home blood pressure monitor.  If you are between 55 years and 79 years old, ask your health care provider if you should take aspirin to prevent strokes.  Have regular diabetes screenings. This involves taking a blood sample to check your fasting blood sugar level.  If you are at a normal weight and have a low risk for diabetes, have this test once every three years after 75 years of age.  If you are overweight and have a high risk for diabetes, consider being tested at a younger age or more often. Preventing infection Hepatitis B  If you have a higher risk for hepatitis B, you should be screened for this virus. You are considered at high risk for hepatitis B if:  You were born in a country where hepatitis B is common. Ask your health care provider which countries are   considered high risk.  Your parents were born in a high-risk country, and you have not been immunized against hepatitis B (hepatitis B vaccine).  You have HIV or AIDS.  You use needles to inject street drugs.  You live with someone who has hepatitis B.  You have had sex with someone who has hepatitis B.  You get hemodialysis treatment.  You take certain medicines for conditions, including cancer, organ transplantation, and autoimmune conditions. Hepatitis C  Blood testing is recommended for:  Everyone born from 1945 through 1965.  Anyone with known risk factors for hepatitis C. Osteoporosis and menopause  Osteoporosis is a disease in which the bones lose minerals and strength with aging. This can result in serious bone fractures. Your risk for osteoporosis can be identified using a bone density scan.  If you are 65 years of age or older, or if you are at risk for osteoporosis and fractures, ask your health care provider if you should be screened.  Ask your health care provider whether you should take  a calcium or vitamin D supplement to lower your risk for osteoporosis.  Menopause may have certain physical symptoms and risks.  Hormone replacement therapy may reduce some of these symptoms and risks. Talk to your health care provider about whether hormone replacement therapy is right for you. Follow these instructions at home:  Schedule regular health, dental, and eye exams.  Stay current with your immunizations.  Do not use any tobacco products including cigarettes, chewing tobacco, or electronic cigarettes.  If you are pregnant, do not drink alcohol.  If you are breastfeeding, limit how much and how often you drink alcohol.  Limit alcohol intake to no more than 1 drink per day for nonpregnant women. One drink equals 12 ounces of beer, 5 ounces of wine, or 1 ounces of hard liquor.  Do not use street drugs.  Do not share needles.  Ask your health care provider for help if you need support or information about quitting drugs.  Tell your health care provider if you often feel depressed.  Tell your health care provider if you have ever been abused or do not feel safe at home. This information is not intended to replace advice given to you by your health care provider. Make sure you discuss any questions you have with your health care provider. Document Released: 12/18/2010 Document Revised: 11/10/2015 Document Reviewed: 03/08/2015  2017 [employer]vier  

## 2016-08-10 NOTE — Progress Notes (Signed)
Patient: Tasha Wallace, Female    DOB: 07/10/1940, 76 y.o.   MRN: [id number] Visit Date: 08/10/2016  Today's Provider: Margaretann LovelessJennifer M Leilana Mcquire, PA-C   Chief Complaint  Patient presents with  . Annual Exam  . Hyperlipidemia   Subjective:    Annual physical exam Tasha Wallace is a 76 y.o. female who presents today for health maintenance and complete physical. She feels well. She reports exercising. She reports she is sleeping well.  -----------------------------------------------------------------  Lipid/Cholesterol, Follow-up:    Last Lipid Panel:    Component Value Date/Time   CHOL 295 (H) 07/18/2015 1151   TRIG 192 (H) 07/18/2015 1151   HDL 58 07/18/2015 1151   LDLCALC 199 (H) 07/18/2015 1151   She is not taking her Simvastatin for cholesterol.   Wt Readings from Last 3 Encounters:  08/10/16 159 lb 6.4 oz (72.3 kg)  10/24/15 158 lb (71.7 kg)  06/17/15 157 lb (71.2 kg)   -------------------------------------------------------------------   Review of Systems  Constitutional: Negative.   HENT: Positive for sneezing and tinnitus (very seldom). Negative for congestion, postnasal drip, rhinorrhea, sinus pain and sinus pressure.   Eyes: Negative.   Respiratory: Negative.   Cardiovascular: Negative.   Gastrointestinal: Positive for abdominal distention. Negative for abdominal pain, constipation, diarrhea, nausea and vomiting.  Endocrine: Positive for cold intolerance.  Genitourinary: Negative.   Musculoskeletal: Negative.   Skin: Negative.   Allergic/Immunologic: Negative.   Neurological: Negative.   Hematological: Negative.   Psychiatric/Behavioral: Negative.     Social History      She  reports that she quit smoking about 4 years ago. Her smoking use included Cigarettes. She has never used smokeless tobacco. She reports that she drinks about 1.2 oz of alcohol per week . She reports that she does not use drugs.       Social History   Social History  . Marital  status: Divorced    Spouse name: N/A  . Number of children: N/A  . Years of education: N/A   Social History Main Topics  . Smoking status: Former Smoker    Types: Cigarettes    Quit date: 06/17/2012  . Smokeless tobacco: Never Used  . Alcohol use 1.2 oz/week    2 Shots of liquor per week  . Drug use: No  . Sexual activity: Not on file   Other Topics Concern  . Not on file   Social History Narrative  . No narrative on file    Past Medical History:  Diagnosis Date  . Hyperlipidemia      Patient Active Problem List   Diagnosis Date Noted  . Cystitis 06/15/2015  . CD (contact dermatitis) 06/10/2015  . Blood in the urine 06/10/2015  . Osteopenia 06/10/2015  . Dizziness and giddiness 07/08/2009  . Esophagitis, reflux 11/12/2008  . Hypercholesteremia 11/12/2008  . ADD (attention deficit disorder) 11/03/2002  . Adaptation reaction 11/03/2002    Past Surgical History:  Procedure Laterality Date  . GANGLION CYST EXCISION  1986  . IRRIGATION AND DEBRIDEMENT SEBACEOUS CYST  1990  . TONSILLECTOMY AND ADENOIDECTOMY  1950  . URETHRA SURGERY  1971    Family History        Family Status  Relation Status  . Mother Deceased at age 10789  . Father Deceased at age 75   MI        Her family history includes CVA in her mother; Heart disease in her father; Hypertension in her mother.  Allergies  Allergen Reactions  . Metronidazole Rash     Current Outpatient Prescriptions:  .  CALCIUM-VITAMIN D PO, Take 1 tablet by mouth as needed. , Disp: , Rfl:  .  Cyanocobalamin (B-12 PO), Take 1 tablet by mouth as needed (takes occasionally)., Disp: , Rfl:  .  Ginger, Zingiber officinalis, (GINGER PO), Take 1 capsule by mouth as needed. , Disp: , Rfl:  .  Influenza Vac Split High-Dose (FLUZONE HIGH-DOSE) 0.5 ML SUSY, , Disp: , Rfl:  .  Multiple Vitamin (MULTIVITAMIN WITH MINERALS) TABS tablet, Take 1 tablet by mouth as needed. , Disp: , Rfl:  .  omeprazole (PRILOSEC) 20 MG capsule,  TAKE ONE CAPSULE BY MOUTH EVERY DAY (Patient not taking: Reported on 08/10/2016), Disp: 90 capsule, Rfl: 1 .  simvastatin (ZOCOR) 10 MG tablet, TAKE 1 TABLET (10 MG TOTAL) BY MOUTH DAILY. (Patient not taking: Reported on 08/10/2016), Disp: 90 tablet, Rfl: 1   Patient Care Team: Margaretann Loveless, PA-C as PCP - General (Family Medicine)      Objective:   Vitals: BP 128/72 (BP Location: Right Arm)   Pulse 68   Temp 97.9 F (36.6 C) (Oral)   Ht 5\' 6"  (1.676 m)   Wt 159 lb 6.4 oz (72.3 kg)   BMI 25.73 kg/m   Body mass index is 25.73 kg/m.  Physical Exam  Constitutional: She is oriented to person, place, and time. She appears well-developed and well-nourished. No distress.  HENT:  Head: Normocephalic and atraumatic.  Right Ear: Hearing, tympanic membrane, external ear and ear canal normal.  Left Ear: Hearing, tympanic membrane, external ear and ear canal normal.  Nose: Nose normal.  Mouth/Throat: Uvula is midline, oropharynx is clear and moist and mucous membranes are normal. No oropharyngeal exudate.  Eyes: Conjunctivae and EOM are normal. Pupils are equal, round, and reactive to light. Right eye exhibits no discharge. Left eye exhibits no discharge. No scleral icterus.  Neck: Normal range of motion. Neck supple. No JVD present. Carotid bruit is not present. No tracheal deviation present. No thyromegaly present.  Cardiovascular: Normal rate, regular rhythm, normal heart sounds and intact distal pulses.  Exam reveals no gallop and no friction rub.   No murmur heard. Pulmonary/Chest: Effort normal and breath sounds normal. No respiratory distress. She has no wheezes. She has no rales. She exhibits no tenderness.  Abdominal: Soft. Bowel sounds are normal. She exhibits no distension and no mass. There is no tenderness. There is no rebound and no guarding.  Musculoskeletal: Normal range of motion. She exhibits no edema or tenderness.  Lymphadenopathy:    She has no cervical adenopathy.    Neurological: She is alert and oriented to person, place, and time.  Skin: Skin is warm and dry. No rash noted. She is not diaphoretic.  Psychiatric: She has a normal mood and affect. Her behavior is normal. Judgment and thought content normal.  Vitals reviewed.    Depression Screen PHQ 2/9 Scores 08/10/2016 06/17/2015  PHQ - 2 Score 0 0      Assessment & Plan:     Routine Health Maintenance and Physical Exam  Exercise Activities and Dietary recommendations Goals    . Exercise 150 minutes per week (moderate activity) (pt-stated)          08/10/16- Pt has succeeded in completing last years goal of exercising 150 minutes a week.        Immunization History  Administered Date(s) Administered  . Pneumococcal Conjugate-13 07/31/2013  . Pneumococcal Polysaccharide-23  07/08/2009  . Td 05/18/2004, 07/31/2013  . Tdap 07/31/2013, 10/14/2015  . Zoster 01/13/2009    Health Maintenance  Topic Date Due  . MAMMOGRAM  09/19/2016  . COLONOSCOPY  03/06/2022  . TETANUS/TDAP  10/13/2025  . INFLUENZA VACCINE  Completed  . DEXA SCAN  Completed  . PNA vac Low Risk Adult  Completed     Discussed health benefits of physical activity, and encouraged her to engage in regular exercise appropriate for her age and condition.    1. Hypercholesteremia Previously stable. Patient self discontinued simvastatin. Will check labs as below and f/u pending lab results. - CBC w/Diff/Platelet - Lipid Profile - Comprehensive Metabolic Panel (CMET)  2. Osteopenia, unspecified location Previous T score -2.2 in 2015. She was taking Vit-D - Calcium supplement but discontinued this as well. Will recheck BMD to see where her progress is at this time. - Comprehensive Metabolic Panel (CMET) - DG Bone Density; Future  3. Postmenopausal estrogen deficiency See above medical treatment plan. - DG Bone Density; Future  4. Breast cancer screening Patient refuses any future mammograms at this time.    --------------------------------------------------------------------    Margaretann Loveless, PA-C  [facility]

## 2016-08-10 NOTE — Progress Notes (Signed)
Subjective:   Tasha Wallace is a 76 y.o. female who presents for Medicare Annual (Subsequent) preventive examination.  Review of Systems:  N/A  Cardiac Risk Factors include: advanced age (>4155men, 76>65 women);dyslipidemia     Objective:     Vitals: BP 128/72 (BP Location: Right Arm)   Pulse 68   Temp 97.9 F (36.6 C) (Oral)   Ht 5\' 6"  (1.676 m)   Wt 159 lb 6.4 oz (72.3 kg)   BMI 25.73 kg/m   Body mass index is 25.73 kg/m.   Tobacco History  Smoking Status  . Former Smoker  . Types: Cigarettes  . Quit date: 06/17/2012  Smokeless Tobacco  . Never Used     Counseling given: Not Answered   Past Medical History:  Diagnosis Date  . Hyperlipidemia    Past Surgical History:  Procedure Laterality Date  . GANGLION CYST EXCISION  1986  . IRRIGATION AND DEBRIDEMENT SEBACEOUS CYST  1990  . TONSILLECTOMY AND ADENOIDECTOMY  1950  . URETHRA SURGERY  1971   Family History  Problem Relation Age of Onset  . Hypertension Mother   . CVA Mother   . Heart disease Father    History  Sexual Activity  . Sexual activity: Not on file    Outpatient Encounter Prescriptions as of 08/10/2016  Medication Sig  . Influenza Vac Split High-Dose (FLUZONE HIGH-DOSE) 0.5 ML SUSY   . CALCIUM-VITAMIN D PO Take 1 tablet by mouth as needed.   . Cyanocobalamin (B-12 PO) Take 1 tablet by mouth as needed (takes occasionally).  . Ginger, Zingiber officinalis, (GINGER PO) Take 1 capsule by mouth as needed.   . Multiple Vitamin (MULTIVITAMIN WITH MINERALS) TABS tablet Take 1 tablet by mouth as needed.   Marland Kitchen. omeprazole (PRILOSEC) 20 MG capsule TAKE ONE CAPSULE BY MOUTH EVERY DAY (Patient not taking: Reported on 08/10/2016)  . simvastatin (ZOCOR) 10 MG tablet TAKE 1 TABLET (10 MG TOTAL) BY MOUTH DAILY. (Patient not taking: Reported on 08/10/2016)   No facility-administered encounter medications on file as of 08/10/2016.     Activities of Daily Living In your present state of health, do you have any  difficulty performing the following activities: 08/10/2016  Hearing? Y  Vision? N  Difficulty concentrating or making decisions? Y  Walking or climbing stairs? Y  Dressing or bathing? N  Doing errands, shopping? N  Preparing Food and eating ? N  Using the Toilet? N  In the past six months, have you accidently leaked urine? Y  Do you have problems with loss of bowel control? N  Managing your Medications? N  Managing your Finances? N  Housekeeping or managing your Housekeeping? N  Some recent data might be hidden    Patient Care Team: Margaretann LovelessJennifer M Burnette, PA-C as PCP - General (Family Medicine)    Assessment:     Exercise Activities and Dietary recommendations Current Exercise Habits: Home exercise routine, Type of exercise: strength training/weights;stretching;Other - see comments (home machine), Time (Minutes): 30, Frequency (Times/Week): 7, Weekly Exercise (Minutes/Week): 210, Intensity: Mild, Exercise limited by: None identified  Goals      Patient Stated   . Exercise 150 minutes per week (moderate activity) (pt-stated)          08/10/16- Pt has succeeded in completing last years goal of exercising 150 minutes a week.       Fall Risk Fall Risk  08/10/2016 06/17/2015  Falls in the past year? No No   Depression Screen PHQ 2/9 Scores  08/10/2016 06/17/2015  PHQ - 2 Score 0 0     Cognitive Function     6CIT Screen 08/10/2016  What Year? 0 points  What month? 0 points  What time? 0 points  Count back from 20 0 points  Months in reverse 0 points  Repeat phrase 4 points  Total Score 4    Immunization History  Administered Date(s) Administered  . Pneumococcal Conjugate-13 07/31/2013  . Pneumococcal Polysaccharide-23 07/08/2009  . Td 05/18/2004, 07/31/2013  . Tdap 07/31/2013, 10/14/2015  . Zoster 01/13/2009   Screening Tests Health Maintenance  Topic Date Due  . MAMMOGRAM  09/19/2016  . COLONOSCOPY  03/06/2022  . TETANUS/TDAP  10/13/2025  . INFLUENZA VACCINE   Completed  . DEXA SCAN  Completed  . PNA vac Low Risk Adult  Completed      Plan:  I have personally reviewed and addressed the Medicare Annual Wellness questionnaire and have noted the following in the patient's chart:  A. Medical and social history B. Use of alcohol, tobacco or illicit drugs  C. Current medications and supplements D. Functional ability and status E.  Nutritional status F.  Physical activity G. Advance directives H. List of other physicians I.  Hospitalizations, surgeries, and ER visits in previous 12 months J.  Vitals K. Screenings such as hearing and vision if needed, cognitive and depression L. Referrals and appointments - none  In addition, I have reviewed and discussed with patient certain preventive protocols, quality metrics, and best practice recommendations. A written personalized care plan for preventive services as well as general preventive health recommendations were provided to patient.  See attached scanned questionnaire for additional information.   Signed,  Hyacinth Meeker, LPN Nurse Health Advisor   MD Recommendations: None.  I have reviewed the documentation and information obtained by Hyacinth Meeker, LPN in the above chart and agree as above. I was available for consultation if any questions or issues arose.  Joycelyn Man, PA-C

## 2016-09-22 ENCOUNTER — Other Ambulatory Visit: Payer: Self-pay | Admitting: Physician Assistant

## 2016-10-02 ENCOUNTER — Ambulatory Visit: Admission: RE | Admit: 2016-10-02 | Payer: Medicare Other | Source: Ambulatory Visit

## 2016-10-26 ENCOUNTER — Encounter: Payer: Self-pay | Admitting: Physician Assistant

## 2016-10-26 ENCOUNTER — Ambulatory Visit (INDEPENDENT_AMBULATORY_CARE_PROVIDER_SITE_OTHER): Payer: Medicare Other | Admitting: Physician Assistant

## 2016-10-26 VITALS — BP 122/70 | HR 82 | Temp 97.3°F | Resp 16 | Wt 153.8 lb

## 2016-10-26 DIAGNOSIS — L0292 Furuncle, unspecified: Secondary | ICD-10-CM | POA: Diagnosis not present

## 2016-10-26 DIAGNOSIS — R1032 Left lower quadrant pain: Secondary | ICD-10-CM

## 2016-10-26 NOTE — Progress Notes (Signed)
Patient: Tasha Wallace Female    DOB: 1940-12-10   76 y.o.   MRN: [id number] Visit Date: 10/26/2016  Today's Provider: Margaretann Loveless, PA-C   No chief complaint on file.  Subjective:    HPI Patient is here today with c/o of cyst in vaginal area. Per patient is much better and it drained out. Feels a little tender but better. She reports using warm compresses and soaked in epsom salt once. These treatments helped.   She is also concerned about her left lower abdomen. She reports that in the mornings while in bed when she turns to her right she gets this severe pain on left side and then goes away. It is a sharp pain that she states "If I were standing I would be on my knees when it hits." Reports that it has been ongoing for several months. Does not occur every morning. Last no more than a minute. BM normal, has 2 BM daily. Sometimes can be looser. No melena or hematochezia. Does not occur at any other time during the day.     Allergies  Allergen Reactions  . Metronidazole Rash     Current Outpatient Prescriptions:  .  Cyanocobalamin (B-12 PO), Take 1 tablet by mouth as needed (takes occasionally)., Disp: , Rfl:  .  Ginger, Zingiber officinalis, (GINGER PO), Take 1 capsule by mouth as needed. , Disp: , Rfl:  .  CALCIUM-VITAMIN D PO, Take 1 tablet by mouth as needed. , Disp: , Rfl:  .  Influenza Vac Split High-Dose (FLUZONE HIGH-DOSE) 0.5 ML SUSY, , Disp: , Rfl:  .  Multiple Vitamin (MULTIVITAMIN WITH MINERALS) TABS tablet, Take 1 tablet by mouth as needed. , Disp: , Rfl:  .  omeprazole (PRILOSEC) 20 MG capsule, TAKE ONE CAPSULE BY MOUTH EVERY DAY (Patient not taking: Reported on 10/26/2016), Disp: 90 capsule, Rfl: 1 .  simvastatin (ZOCOR) 10 MG tablet, TAKE 1 TABLET (10 MG TOTAL) BY MOUTH DAILY. (Patient not taking: Reported on 08/10/2016), Disp: 90 tablet, Rfl: 1  Review of Systems  Constitutional: Negative for fatigue and fever.  HENT: Negative.   Cardiovascular: Negative  for chest pain, palpitations and leg swelling.  Gastrointestinal: Positive for abdominal pain. Negative for abdominal distention, anal bleeding, blood in stool, constipation, diarrhea, nausea, rectal pain and vomiting.  Genitourinary: Negative for genital sores, menstrual problem, vaginal bleeding, vaginal discharge and vaginal pain.  Skin: Positive for wound (boil).    Social History  Substance Use Topics  . Smoking status: Former Smoker    Types: Cigarettes    Quit date: 06/17/2012  . Smokeless tobacco: Never Used  . Alcohol use 1.2 oz/week    2 Shots of liquor per week   Objective:   BP 122/70 (BP Location: Right Arm, Patient Position: Sitting, Cuff Size: Normal)   Pulse 82   Temp 97.3 F (36.3 C) (Oral)   Resp 16   Wt 153 lb 12.8 oz (69.8 kg)   BMI 24.82 kg/m      Physical Exam  Constitutional: She is oriented to person, place, and time. She appears well-developed and well-nourished. No distress.  Neck: Normal range of motion. Neck supple. No JVD present. No tracheal deviation present. No thyromegaly present.  Cardiovascular: Normal rate, regular rhythm and normal heart sounds.  Exam reveals no gallop and no friction rub.   No murmur heard. Pulmonary/Chest: Effort normal and breath sounds normal. No respiratory distress. She has no wheezes. She has no rales.  Abdominal: Soft. Normal appearance and bowel sounds are normal. She exhibits no distension and no mass. There is no hepatosplenomegaly. There is tenderness in the left lower quadrant. There is no rebound, no guarding and no CVA tenderness. No hernia.    Lymphadenopathy:    She has no cervical adenopathy.  Neurological: She is alert and oriented to person, place, and time.  Skin: Skin is warm and dry. She is not diaphoretic.  Vitals reviewed.     Assessment & Plan:     1. LLQ abdominal pain Will get pelvic and transvaginal US as below since she is having this LLQ pain and does still have all her female  reproductive organs. I will f/u with her pending these results. If negative may consider pelvic CT for further evaluation.  - US Pelvis Complete; Future - US Transvaginal Non-OB; Future  2. Boil Healing. No antibiotic needed. No fluctuation, no current drainage, no induration. Continue warm compresses.       Margaretann LovelessJennifer M Natan Hartog, PA-C  [facility]

## 2016-10-26 NOTE — Patient Instructions (Signed)
Skin Abscess A skin abscess is an infected area on or under your skin that contains a collection of pus and other material. An abscess may also be called a furuncle, carbuncle, or boil. An abscess can occur in or on almost any part of your body. Some abscesses break open (rupture) on their own. Most continue to get worse unless they are treated. The infection can spread deeper into the body and eventually into your blood, which can make you feel ill. Treatment usually involves draining the abscess. What are the causes? An abscess occurs when germs, often bacteria, pass through your skin and cause an infection. This may be caused by:  A scrape or cut on your skin.  A puncture wound through your skin, including a needle injection.  Blocked oil or sweat glands.  Blocked and infected hair follicles.  A cyst that forms beneath your skin (sebaceous cyst) and becomes infected. What increases the risk? This condition is more likely to develop in people who:  Have a weak body defense system (immune system).  Have diabetes.  Have dry and irritated skin.  Get frequent injections or use illegal IV drugs.  Have a foreign body in a wound, such as a splinter.  Have problems with their lymph system or veins. What are the signs or symptoms? An abscess may start as a painful, firm bump under the skin. Over time, the abscess may get larger or become softer. Pus may appear at the top of the abscess, causing pressure and pain. It may eventually break through the skin and drain. Other symptoms include:  Redness.  Warmth.  Swelling.  Tenderness.  A sore on the skin. How is this diagnosed? This condition is diagnosed based on your medical history and a physical exam. A sample of pus may be taken from the abscess to find out what is causing the infection and what antibiotics can be used to treat it. You also may have:  Blood tests to look for signs of infection or spread of an infection to your  blood.  Imaging studies such as ultrasound, CT scan, or MRI if the abscess is deep. How is this treated? Small abscesses that drain on their own may not need treatment. Treatment for an abscess that does not rupture on its own may include:  Warm compresses applied to the area several times per day.  Incision and drainage. Your health care provider will make an incision to open the abscess and will remove pus and any foreign body or dead tissue. The incision area may be packed with gauze to keep it open for a few days while it heals.  Antibiotic medicines to treat infection. For a severe abscess, you may first get antibiotics through an IV and then change to oral antibiotics. Follow these instructions at home: Abscess Care   If you have an abscess that has not drained, place a warm, clean, wet washcloth over the abscess several times a day. Do this as told by your health care provider.  Follow instructions from your health care provider about how to take care of your abscess. Make sure you:  Cover the abscess with a bandage (dressing).  Change your dressing or gauze as told by your health care provider.  Wash your hands with soap and water before you change the dressing or gauze. If soap and water are not available, use hand sanitizer.  Check your abscess every day for signs of a worsening infection. Check for:  More redness, swelling, or   pain.  More fluid or blood.  Warmth.  More pus or a bad smell. Medicines   Take over-the-counter and prescription medicines only as told by your health care provider.  If you were prescribed an antibiotic medicine, take it as told by your health care provider. Do not stop taking the antibiotic even if you start to feel better. General instructions   To avoid spreading the infection:  Do not share personal care items, towels, or hot tubs with others.  Avoid making skin contact with other people.  Keep all follow-up visits as told by your  health care provider. This is important. Contact a health care provider if:  You have more redness, swelling, or pain around your abscess.  You have more fluid or blood coming from your abscess.  Your abscess feels warm to the touch.  You have more pus or a bad smell coming from your abscess.  You have a fever.  You have muscle aches.  You have chills or a general ill feeling. Get help right away if:  You have severe pain.  You see red streaks on your skin spreading away from the abscess. This information is not intended to replace advice given to you by your health care provider. Make sure you discuss any questions you have with your health care provider. Document Released: 03/14/2005 Document Revised: 01/29/2016 Document Reviewed: 04/13/2015 Elsevier Interactive Patient Education  2017 Elsevier Inc.  

## 2016-11-01 ENCOUNTER — Ambulatory Visit: Payer: Medicare Other

## 2016-11-06 ENCOUNTER — Ambulatory Visit
Admission: RE | Admit: 2016-11-06 | Discharge: 2016-11-06 | Disposition: A | Discharge status: Home / Self Care | Payer: Medicare Other | Source: Ambulatory Visit | Attending: Physician Assistant | Admitting: Physician Assistant

## 2016-11-06 DIAGNOSIS — R1032 Left lower quadrant pain: Secondary | ICD-10-CM | POA: Diagnosis present

## 2016-11-13 ENCOUNTER — Telehealth: Payer: Self-pay

## 2016-11-13 NOTE — Telephone Encounter (Signed)
Patient has been advised she states that she is feeling well and pain has subsided. KW

## 2016-11-13 NOTE — Telephone Encounter (Signed)
-----   Message from Margaretann LovelessJennifer M Burnette, [place]PA-C sent at 11/13/2016  2:04 PM EDT ----- Normal pelvic Tasha Wallace. Can we see if patient is still having LLQ pain?

## 2017-01-05 LAB — COMPREHENSIVE METABOLIC PANEL
ALK PHOS: 85 IU/L (ref 39–117)
ALT: 15 IU/L (ref 0–32)
AST: 20 IU/L (ref 0–40)
Albumin/Globulin Ratio: 1.5 (ref 1.2–2.2)
Albumin: 4.3 g/dL (ref 3.5–4.8)
BILIRUBIN TOTAL: 0.2 mg/dL (ref 0.0–1.2)
BUN/Creatinine Ratio: 17 (ref 12–28)
BUN: 16 mg/dL (ref 8–27)
CHLORIDE: 105 mmol/L (ref 96–106)
CO2: 22 mmol/L (ref 20–29)
Calcium: 9.6 mg/dL (ref 8.7–10.3)
Creatinine, Ser: 0.94 mg/dL (ref 0.57–1.00)
GFR calc non Af Amer: 60 mL/min/{1.73_m2} (ref >59)
GFR, EST AFRICAN AMERICAN: 69 mL/min/{1.73_m2} (ref >59)
GLUCOSE: 94 mg/dL (ref 65–99)
Globulin, Total: 2.8 g/dL (ref 1.5–4.5)
Potassium: 4.4 mmol/L (ref 3.5–5.2)
Sodium: 143 mmol/L (ref 134–144)
TOTAL PROTEIN: 7.1 g/dL (ref 6.0–8.5)

## 2017-01-05 LAB — CBC WITH DIFFERENTIAL/PLATELET
BASOS: 1 %
Basophils Absolute: 0 10*3/uL (ref 0.0–0.2)
EOS (ABSOLUTE): 0.1 10*3/uL (ref 0.0–0.4)
EOS: 1 %
HEMATOCRIT: 39.4 % (ref 34.0–46.6)
Hemoglobin: 13.1 g/dL (ref 11.1–15.9)
IMMATURE GRANULOCYTES: 0 %
Immature Grans (Abs): 0 10*3/uL (ref 0.0–0.1)
LYMPHS ABS: 1.5 10*3/uL (ref 0.7–3.1)
Lymphs: 24 %
MCH: 31 pg (ref 26.6–33.0)
MCHC: 33.2 g/dL (ref 31.5–35.7)
MCV: 93 fL (ref 79–97)
MONOS ABS: 0.6 10*3/uL (ref 0.1–0.9)
Monocytes: 10 %
Neutrophils Absolute: 3.9 10*3/uL (ref 1.4–7.0)
Neutrophils: 64 %
Platelets: 249 10*3/uL (ref 150–379)
RBC: 4.22 x10E6/uL (ref 3.77–5.28)
RDW: 14.1 % (ref 12.3–15.4)
WBC: 6.1 10*3/uL (ref 3.4–10.8)

## 2017-01-05 LAB — LIPID PANEL
CHOL/HDL RATIO: 3.9 ratio (ref 0.0–4.4)
Cholesterol, Total: 260 mg/dL — ABNORMAL HIGH (ref 100–199)
HDL: 67 mg/dL (ref >39)
LDL Calculated: 168 mg/dL — ABNORMAL HIGH (ref 0–99)
Triglycerides: 125 mg/dL (ref 0–149)
VLDL Cholesterol Cal: 25 mg/dL (ref 5–40)

## 2017-05-08 ENCOUNTER — Ambulatory Visit: Payer: Medicare Other | Admitting: Physician Assistant

## 2017-05-08 ENCOUNTER — Encounter: Payer: Self-pay | Admitting: Physician Assistant

## 2017-05-08 VITALS — BP 120/80 | HR 80 | Temp 98.0°F | Resp 16 | Wt 153.0 lb

## 2017-05-08 DIAGNOSIS — J069 Acute upper respiratory infection, unspecified: Secondary | ICD-10-CM | POA: Diagnosis not present

## 2017-05-08 DIAGNOSIS — Z87891 Personal history of nicotine dependence: Secondary | ICD-10-CM

## 2017-05-08 DIAGNOSIS — Z122 Encounter for screening for malignant neoplasm of respiratory organs: Secondary | ICD-10-CM

## 2017-05-08 MED ORDER — AZITHROMYCIN 250 MG PO TABS: ORAL_TABLET | ORAL | 0 refills | Status: DC

## 2017-05-08 NOTE — Progress Notes (Signed)
Patient: Tasha Wallace Female    DOB: 1941-04-09   76 y.o.   MRN: [id number] Visit Date: 05/08/2017  Today's Provider: Margaretann LovelessJennifer M [place] Welcome, PA-C   Chief Complaint  Patient presents with  . URI   Subjective:    URI   This is a new problem. The current episode started 1 to 4 weeks ago (She reports that last Thursday she started  feeling awful and she had really bad vertigo for one day.). The problem has been unchanged. There has been no fever. Associated symptoms include congestion, coughing and sneezing. Pertinent negatives include no chest pain, ear pain, headaches, rhinorrhea, sinus pain, sore throat or wheezing.   Patient is also requesting medication to help with her Vertigo.  Patient is also interested in having lung cancer screening. She reports she started smoking more regularly when she was 36, social smoking before then. She reports she smoked close to 1 pack per day. She quit in 2013. This averages to close to 35 pack year smoking history thus she would be a candidate for the low dose CT.     Allergies  Allergen Reactions  . Metronidazole Rash     Current Outpatient Medications:  .  Ginger, Zingiber officinalis, (GINGER PO), Take 1 capsule by mouth as needed. , Disp: , Rfl:  .  omeprazole (PRILOSEC) 20 MG capsule, TAKE ONE CAPSULE BY MOUTH EVERY DAY, Disp: 90 capsule, Rfl: 1 .  CALCIUM-VITAMIN D PO, Take 1 tablet by mouth as needed. , Disp: , Rfl:  .  Cyanocobalamin (B-12 PO), Take 1 tablet by mouth as needed (takes occasionally)., Disp: , Rfl:  .  Influenza Vac Split High-Dose (FLUZONE HIGH-DOSE) 0.5 ML SUSY, , Disp: , Rfl:  .  Multiple Vitamin (MULTIVITAMIN WITH MINERALS) TABS tablet, Take 1 tablet by mouth as needed. , Disp: , Rfl:  .  simvastatin (ZOCOR) 10 MG tablet, TAKE 1 TABLET (10 MG TOTAL) BY MOUTH DAILY. (Patient not taking: Reported on 08/10/2016), Disp: 90 tablet, Rfl: 1  Review of Systems  Constitutional: Negative for fatigue and fever.  HENT:  Positive for congestion and sneezing. Negative for ear pain, postnasal drip, rhinorrhea, sinus pressure, sinus pain and sore throat.   Respiratory: Positive for cough. Negative for chest tightness, shortness of breath and wheezing.   Cardiovascular: Positive for leg swelling. Negative for chest pain and palpitations.  Gastrointestinal: Negative.   Neurological: Negative for dizziness and headaches.    Social History   Tobacco Use  . Smoking status: Former Smoker    Types: Cigarettes    Last attempt to quit: 06/17/2012    Years since quitting: 4.8  . Smokeless tobacco: Never Used  Substance Use Topics  . Alcohol use: Yes    Alcohol/week: 1.2 oz    Types: 2 Shots of liquor per week   Objective:   BP 120/80 (BP Location: Left Arm, Patient Position: Sitting, Cuff Size: Normal)   Pulse 80   Temp 98 F (36.7 C) (Oral)   Resp 16   Wt 153 lb (69.4 kg)   SpO2 98%   BMI 24.69 kg/m    Physical Exam  Constitutional: She appears well-developed and well-nourished. No distress.  HENT:  Head: Normocephalic and atraumatic.  Right Ear: Hearing, tympanic membrane, external ear and ear canal normal.  Left Ear: Hearing, tympanic membrane, external ear and ear canal normal.  Nose: Nose normal.  Mouth/Throat: Uvula is midline, oropharynx is clear and moist and mucous membranes are normal. No  oropharyngeal exudate.  Eyes: Conjunctivae are normal. Pupils are equal, round, and reactive to light. Right eye exhibits no discharge. Left eye exhibits no discharge. No scleral icterus.  Neck: Normal range of motion. Neck supple. No tracheal deviation present. No thyromegaly present.  Cardiovascular: Normal rate, regular rhythm and normal heart sounds. Exam reveals no gallop and no friction rub.  No murmur heard. Pulmonary/Chest: Effort normal. No stridor. No respiratory distress. She has no decreased breath sounds. She has no wheezes. She has no rhonchi. She has rales in the right lower field and the left  lower field.  Lymphadenopathy:    She has no cervical adenopathy.  Skin: Skin is warm and dry. She is not diaphoretic.  Vitals reviewed.      Assessment & Plan:     1. Upper respiratory tract infection, unspecified type Worsening symptoms that have not responded to OTC medications. Will give azithromycin as below. Continue allergy medications. Stay well hydrated and get plenty of rest. Call if no symptom improvement or if symptoms worsen. - azithromycin (ZITHROMAX) 250 MG tablet; Take 2 tabs PO on day 1 then 1 tab PO daily until completed  Dispense: 6 tablet; Refill: 0  2. Encounter for screening for malignant neoplasm of respiratory organs 35 pack year history that quit in 2013. No previous lung cancer screening. CT low dose ordered as below.  - CT CHEST LUNG CA SCREEN LOW DOSE W/O CM; Future  3. History of smoking 30 or more pack years - CT CHEST LUNG CA SCREEN LOW DOSE W/O CM; Future       Margaretann LovelessJennifer M Theodora Lalanne, PA-C  [facility]

## 2017-05-08 NOTE — Patient Instructions (Addendum)
[place] for vertigo    Upper Respiratory Infection, Adult Most upper respiratory infections (URIs) are caused by a virus. A URI affects the nose, throat, and upper air passages. The most common type of URI is often called "the common cold." Follow these instructions at home:  Take medicines only as told by your doctor.  Gargle warm saltwater or take cough drops to comfort your throat as told by your doctor.  Use a warm mist humidifier or inhale steam from a shower to increase air moisture. This may make it easier to breathe.  Drink enough fluid to keep your pee (urine) clear or pale yellow.  Eat soups and other clear broths.  Have a healthy diet.  Rest as needed.  Go back to work when your fever is gone or your doctor says it is okay. ? You may need to stay home longer to avoid giving your URI to others. ? You can also wear a face mask and wash your hands often to prevent spread of the virus.  Use your inhaler more if you have asthma.  Do not use any tobacco products, including cigarettes, chewing tobacco, or electronic cigarettes. If you need help quitting, ask your doctor. Contact a doctor if:  You are getting worse, not better.  Your symptoms are not helped by medicine.  You have chills.  You are getting more short of breath.  You have brown or red mucus.  You have yellow or brown discharge from your nose.  You have pain in your face, especially when you bend forward.  You have a fever.  You have puffy (swollen) neck glands.  You have pain while swallowing.  You have white areas in the back of your throat. Get help right away if:  You have very bad or constant: ? Headache. ? Ear pain. ? Pain in your forehead, behind your eyes, and over your cheekbones (sinus pain). ? Chest pain.  You have long-lasting (chronic) lung disease and any of the following: ? Wheezing. ? Long-lasting cough. ? Coughing up blood. ? A change in your usual mucus.  You have a  stiff neck.  You have changes in your: ? Vision. ? Hearing. ? Thinking. ? Mood. This information is not intended to replace advice given to you by your health care provider. Make sure you discuss any questions you have with your health care provider. Document Released: 11/21/2007 Document Revised: 02/05/2016 Document Reviewed: 09/09/2013 Elsevier Interactive Patient Education  2018 [employer]

## 2017-05-15 ENCOUNTER — Telehealth: Payer: Self-pay | Admitting: *Deleted

## 2017-05-15 DIAGNOSIS — Z87891 Personal history of nicotine dependence: Secondary | ICD-10-CM

## 2017-05-15 DIAGNOSIS — Z122 Encounter for screening for malignant neoplasm of respiratory organs: Secondary | ICD-10-CM

## 2017-05-15 NOTE — Telephone Encounter (Signed)
Received referral for initial lung cancer screening scan. Contacted patient and obtained smoking history,(former, quit 2013, 35 pack year) as well as answering questions related to screening process. Patient denies signs of lung cancer such as weight loss or hemoptysis. Patient denies comorbidity that would prevent curative treatment if lung cancer were found. Patient is scheduled for shared decision making visit and CT scan on 05/22/17.

## 2017-05-21 ENCOUNTER — Encounter: Payer: Self-pay | Admitting: Oncology

## 2017-05-22 ENCOUNTER — Ambulatory Visit
Admission: RE | Admit: 2017-05-22 | Discharge: 2017-05-22 | Disposition: A | Discharge status: Home / Self Care | Payer: Medicare Other | Source: Ambulatory Visit | Attending: Oncology | Admitting: Oncology

## 2017-05-22 ENCOUNTER — Inpatient Hospital Stay: Payer: Medicare Other | Attending: Oncology | Admitting: Oncology

## 2017-05-22 DIAGNOSIS — R918 Other nonspecific abnormal finding of lung field: Secondary | ICD-10-CM | POA: Diagnosis not present

## 2017-05-22 DIAGNOSIS — I700 Atherosclerosis of aorta: Secondary | ICD-10-CM | POA: Diagnosis not present

## 2017-05-22 DIAGNOSIS — Z122 Encounter for screening for malignant neoplasm of respiratory organs: Secondary | ICD-10-CM | POA: Diagnosis present

## 2017-05-22 DIAGNOSIS — Z87891 Personal history of nicotine dependence: Secondary | ICD-10-CM

## 2017-05-22 NOTE — Progress Notes (Signed)
In accordance with CMS guidelines, patient has met eligibility criteria including age, absence of signs or symptoms of lung cancer.  Social History   Tobacco Use  . Smoking status: Former Smoker    Packs/day: 1.00    Years: 35.00    Pack years: 35.00    Types: Cigarettes    Last attempt to quit: 06/17/2012    Years since quitting: 4.9  . Smokeless tobacco: Never Used  Substance Use Topics  . Alcohol use: Yes    Alcohol/week: 1.2 oz    Types: 2 Shots of liquor per week  . Drug use: No     A shared decision-making session was conducted prior to the performance of CT scan. This includes one or more decision aids, includes benefits and harms of screening, follow-up diagnostic testing, over-diagnosis, false positive rate, and total radiation exposure.  Counseling on the importance of adherence to annual lung cancer LDCT screening, impact of co-morbidities, and ability or willingness to undergo diagnosis and treatment is imperative for compliance of the program.  Counseling on the importance of continued smoking cessation for former smokers; the importance of smoking cessation for current smokers, and information about tobacco cessation interventions have been given to patient including [place] and [facility] quit [place] programs.  Written order for lung cancer screening with LDCT has been given to the patient and any and all questions have been answered to the best of my abilities.   Yearly follow up will be coordinated by Burgess Estelle, Thoracic Navigator.  Faythe Casa, NP 05/22/2017 2:24 PM

## 2017-06-04 ENCOUNTER — Telehealth: Payer: Self-pay | Admitting: *Deleted

## 2017-06-04 DIAGNOSIS — R918 Other nonspecific abnormal finding of lung field: Secondary | ICD-10-CM

## 2017-06-04 DIAGNOSIS — Z122 Encounter for screening for malignant neoplasm of respiratory organs: Secondary | ICD-10-CM

## 2017-06-04 DIAGNOSIS — Z87891 Personal history of nicotine dependence: Secondary | ICD-10-CM

## 2017-06-04 NOTE — Telephone Encounter (Signed)
Contacted patient regarding radiologist recommendation for LCS nodule follow up imaging. Patient is agreeable for scan to be scheduled. Please see recent documentation regarding other eligibility information.   "IMPRESSION: 1. Lung-RADS 2, benign appearance or behavior. Continue annual screening with low-dose chest CT without contrast in 12 months. 2. Probable area of developing scarring in the right lower lobe. This area was poorly segmented in DynaCAD lung software. Follow-up CT chest in 3-4 weeks is recommended to ensure continued resolution and exclude underlying malignancy, as clinically indicated. These results will be called to the ordering clinician or representative by the Radiologist Assistant, and communication documented in the PACS or zVision Dashboard. 3.  Aortic atherosclerosis (ICD10-170.0)."

## 2017-06-25 ENCOUNTER — Ambulatory Visit: Admission: RE | Admit: 2017-06-25 | Payer: Medicare Other | Source: Ambulatory Visit

## 2017-06-27 ENCOUNTER — Ambulatory Visit: Admission: RE | Admit: 2017-06-27 | Payer: Medicare Other | Source: Ambulatory Visit

## 2017-07-02 ENCOUNTER — Ambulatory Visit
Admission: RE | Admit: 2017-07-02 | Discharge: 2017-07-02 | Disposition: A | Discharge status: Home / Self Care | Payer: Medicare Other | Source: Ambulatory Visit | Attending: Oncology | Admitting: Oncology

## 2017-07-02 DIAGNOSIS — R918 Other nonspecific abnormal finding of lung field: Secondary | ICD-10-CM

## 2017-07-02 DIAGNOSIS — I700 Atherosclerosis of aorta: Secondary | ICD-10-CM | POA: Insufficient documentation

## 2017-07-02 DIAGNOSIS — Z122 Encounter for screening for malignant neoplasm of respiratory organs: Secondary | ICD-10-CM

## 2017-07-02 DIAGNOSIS — I2510 Atherosclerotic heart disease of native coronary artery without angina pectoris: Secondary | ICD-10-CM | POA: Diagnosis not present

## 2017-07-02 DIAGNOSIS — Z87891 Personal history of nicotine dependence: Secondary | ICD-10-CM | POA: Insufficient documentation

## 2017-07-02 DIAGNOSIS — J439 Emphysema, unspecified: Secondary | ICD-10-CM | POA: Insufficient documentation

## 2017-07-08 ENCOUNTER — Encounter: Payer: Self-pay | Admitting: Physician Assistant

## 2017-07-08 ENCOUNTER — Encounter: Payer: Self-pay | Admitting: *Deleted

## 2017-07-08 DIAGNOSIS — J449 Chronic obstructive pulmonary disease, unspecified: Secondary | ICD-10-CM | POA: Insufficient documentation

## 2017-07-08 DIAGNOSIS — I700 Atherosclerosis of aorta: Secondary | ICD-10-CM | POA: Insufficient documentation

## 2017-07-10 ENCOUNTER — Telehealth: Payer: Self-pay

## 2017-07-10 NOTE — Telephone Encounter (Signed)
Called to schedule AWV. No answer or VM set up. Will try again later. -MM

## 2017-07-16 NOTE — Telephone Encounter (Signed)
No answer and no VM set up. Will try again another time.

## 2017-08-01 NOTE — Telephone Encounter (Signed)
Pt declined set up of AWV prior to CPE. Pt states she feels it is a waste. FYI! -MM

## 2017-08-12 ENCOUNTER — Encounter: Payer: Self-pay | Admitting: Physician Assistant

## 2017-08-12 ENCOUNTER — Ambulatory Visit (INDEPENDENT_AMBULATORY_CARE_PROVIDER_SITE_OTHER): Payer: Medicare Other | Admitting: Physician Assistant

## 2017-08-12 VITALS — BP 110/70 | HR 78 | Temp 98.0°F | Ht 66.0 in | Wt 155.0 lb

## 2017-08-12 DIAGNOSIS — R7309 Other abnormal glucose: Secondary | ICD-10-CM | POA: Diagnosis not present

## 2017-08-12 DIAGNOSIS — R42 Dizziness and giddiness: Secondary | ICD-10-CM | POA: Diagnosis not present

## 2017-08-12 DIAGNOSIS — I700 Atherosclerosis of aorta: Secondary | ICD-10-CM | POA: Diagnosis not present

## 2017-08-12 DIAGNOSIS — Z1231 Encounter for screening mammogram for malignant neoplasm of breast: Secondary | ICD-10-CM

## 2017-08-12 DIAGNOSIS — Z1211 Encounter for screening for malignant neoplasm of colon: Secondary | ICD-10-CM | POA: Diagnosis not present

## 2017-08-12 DIAGNOSIS — K2100 Gastro-esophageal reflux disease with esophagitis, without bleeding: Secondary | ICD-10-CM

## 2017-08-12 DIAGNOSIS — E7800 Pure hypercholesterolemia, unspecified: Secondary | ICD-10-CM | POA: Diagnosis not present

## 2017-08-12 DIAGNOSIS — Z6825 Body mass index (BMI) 25.0-25.9, adult: Secondary | ICD-10-CM | POA: Diagnosis not present

## 2017-08-12 DIAGNOSIS — J441 Chronic obstructive pulmonary disease with (acute) exacerbation: Secondary | ICD-10-CM

## 2017-08-12 DIAGNOSIS — Z0000 Encounter for general adult medical examination without abnormal findings: Secondary | ICD-10-CM

## 2017-08-12 DIAGNOSIS — Z1239 Encounter for other screening for malignant neoplasm of breast: Secondary | ICD-10-CM

## 2017-08-12 MED ORDER — OMEPRAZOLE 20 MG PO CPDR: 20.0000 mg | DELAYED_RELEASE_CAPSULE | Freq: Every day | ORAL | 1 refills | Status: DC

## 2017-08-12 MED ORDER — DOXYCYCLINE HYCLATE 100 MG PO TABS: 100.0000 mg | ORAL_TABLET | Freq: Two times a day (BID) | ORAL | 0 refills | Status: DC

## 2017-08-12 MED ORDER — MECLIZINE HCL 25 MG PO TABS: 25.0000 mg | ORAL_TABLET | Freq: Three times a day (TID) | ORAL | 5 refills | Status: DC | PRN

## 2017-08-12 NOTE — Progress Notes (Signed)
Patient: Tasha Wallace, Female    DOB: [birth year], 77 y.o.   MRN: [id number] Visit Date: 08/12/2017  Today's Provider: Margaretann Loveless, PA-C   Chief Complaint  Patient presents with  . Medicare Wellness   Subjective:    Annual wellness visit Tasha Wallace is a 77 y.o. female. She feels fairly well. She reports exercising active with daily activites. She reports she is sleeping well. 09/20/14 Mammogram-BI-RADS 1 03/06/12 Colonoscopy-polyp, internal hemorrhoids -----------------------------------------------------------   Review of Systems  Constitutional: Negative.   HENT: Negative.   Eyes: Negative.   Respiratory: Positive for cough.   Cardiovascular: Negative.   Gastrointestinal: Negative.   Endocrine: Negative.   Genitourinary: Negative.   Musculoskeletal: Negative.   Skin: Negative.   Allergic/Immunologic: Negative.   Neurological: Negative.   Psychiatric/Behavioral: Negative.     Social History   Socioeconomic History  . Marital status: Divorced    Spouse name: Not on file  . Number of children: Not on file  . Years of education: Not on file  . Highest education level: Not on file  Social Needs  . Financial resource strain: Not on file  . Food insecurity - worry: Not on file  . Food insecurity - inability: Not on file  . Transportation needs - medical: Not on file  . Transportation needs - non-medical: Not on file  Occupational History  . Not on file  Tobacco Use  . Smoking status: Former Smoker    Packs/day: 1.00    Years: 35.00    Pack years: 35.00    Types: Cigarettes    Last attempt to quit: 06/17/2012    Years since quitting: 5.1  . Smokeless tobacco: Never Used  Substance and Sexual Activity  . Alcohol use: Yes    Alcohol/week: 1.2 oz    Types: 2 Shots of liquor per week  . Drug use: No  . Sexual activity: Not on file  Other Topics Concern  . Not on file  Social History Narrative  . Not on file    Past Medical History:    Diagnosis Date  . Hyperlipidemia      Patient Active Problem List   Diagnosis Date Noted  . COPD (chronic obstructive pulmonary disease) (HCC) 07/08/2017  . Aortic atherosclerosis (HCC) 07/08/2017  . Cystitis 06/15/2015  . CD (contact dermatitis) 06/10/2015  . Blood in the urine 06/10/2015  . Osteopenia 06/10/2015  . Dizziness and giddiness 07/08/2009  . Esophagitis, reflux 11/12/2008  . Hypercholesteremia 11/12/2008  . ADD (attention deficit disorder) 11/03/2002  . Adaptation reaction 11/03/2002    Past Surgical History:  Procedure Laterality Date  . GANGLION CYST EXCISION  1986  . IRRIGATION AND DEBRIDEMENT SEBACEOUS CYST  1990  . TONSILLECTOMY AND ADENOIDECTOMY  1950  . URETHRA SURGERY  1971    Her family history includes CVA in her mother; Heart disease in her father; Hypertension in her mother.      Current Outpatient Medications:  .  CALCIUM-VITAMIN D PO, Take 1 tablet by mouth as needed. , Disp: , Rfl:  .  Cyanocobalamin (B-12 PO), Take 1 tablet by mouth as needed (takes occasionally)., Disp: , Rfl:  .  Ginger, Zingiber officinalis, (GINGER PO), Take 1 capsule by mouth as needed. , Disp: , Rfl:  .  Multiple Vitamin (MULTIVITAMIN WITH MINERALS) TABS tablet, Take 1 tablet by mouth as needed. , Disp: , Rfl:  .  omeprazole (PRILOSEC) 20 MG capsule, TAKE ONE CAPSULE BY MOUTH EVERY DAY,  Disp: 90 capsule, Rfl: 1 .  simvastatin (ZOCOR) 10 MG tablet, TAKE 1 TABLET (10 MG TOTAL) BY MOUTH DAILY. (Patient not taking: Reported on 08/10/2016), Disp: 90 tablet, Rfl: 1  Patient Care Team: Margaretann LovelessBurnette, Isadore Palecek M, PA-C as PCP - General (Family Medicine)     Objective:   Vitals: There were no vitals taken for this visit.  Physical Exam  Constitutional: She is oriented to person, place, and time. She appears well-developed and well-nourished. No distress.  HENT:  Head: Normocephalic and atraumatic.  Right Ear: Hearing, tympanic membrane, external ear and ear canal normal.  Left  Ear: Hearing, tympanic membrane, external ear and ear canal normal.  Nose: Nose normal.  Mouth/Throat: Uvula is midline, oropharynx is clear and moist and mucous membranes are normal. No oropharyngeal exudate.  Eyes: Conjunctivae and EOM are normal. Pupils are equal, round, and reactive to light. Right eye exhibits no discharge. Left eye exhibits no discharge. No scleral icterus.  Neck: Normal range of motion. Neck supple. No JVD present. Carotid bruit is not present. No tracheal deviation present. No thyromegaly present.  Cardiovascular: Normal rate, regular rhythm, normal heart sounds and intact distal pulses. Exam reveals no gallop and no friction rub.  No murmur heard. Pulmonary/Chest: Effort normal. No respiratory distress. She has no decreased breath sounds. She has wheezes in the right upper field. She has no rhonchi. She has no rales. She exhibits no tenderness.  Abdominal: Soft. Bowel sounds are normal. She exhibits no distension and no mass. There is no tenderness. There is no rebound and no guarding.  Musculoskeletal: Normal range of motion. She exhibits no edema or tenderness.  Lymphadenopathy:    She has no cervical adenopathy.  Neurological: She is alert and oriented to person, place, and time.  Skin: Skin is warm and dry. No rash noted. She is not diaphoretic.  Psychiatric: She has a normal mood and affect. Her behavior is normal. Judgment and thought content normal.  Vitals reviewed.   Activities of Daily Living In your present state of health, do you have any difficulty performing the following activities: 08/12/2017  Hearing? Y  Vision? Y  Difficulty concentrating or making decisions? N  Walking or climbing stairs? N  Dressing or bathing? N  Doing errands, shopping? N  Some recent data might be hidden    Fall Risk Assessment Fall Risk  08/12/2017 08/10/2016 06/17/2015  Falls in the past year? No No No     Depression Screen PHQ 2/9 Scores 08/12/2017 08/10/2016  06/17/2015  PHQ - 2 Score 0 0 0  PHQ- 9 Score 0 - -    Cognitive Testing - 6-CIT  Correct? Score   What year is it? yes 0 0 or 4  What month is it? yes 0 0 or 3  Memorize:    [place],  [place],  43,  [place] [address] [place],  [place],      What time is it? (within 1 hour) yes 0 0 or 3  Count backwards from 20 yes 0 0, 2, or 4  Name the months of the year yes 0 0, 2, or 4  Repeat name & address above yes 0 0, 2, 4, 6, 8, or 10       TOTAL SCORE  0/28   Interpretation:  Normal  Normal (0-7) Abnormal (8-28)    Assessment & Plan:     Annual Wellness Visit  Reviewed patient's Family Medical History Reviewed and updated list of patient's medical providers Assessment of cognitive impairment was done  Assessed patient's functional ability Established a written schedule for health screening services Health Risk Assessent Completed and Reviewed  Exercise Activities and Dietary recommendations Goals    . Exercise 150 minutes per week (moderate activity) (pt-stated)     08/10/16- Pt has succeeded in completing last years goal of exercising 150 minutes a week.        Immunization History  Administered Date(s) Administered  . Pneumococcal Conjugate-13 07/31/2013, 03/19/2017  . Pneumococcal Polysaccharide-23 07/08/2009  . Td 05/18/2004, 07/31/2013  . Tdap 07/31/2013, 10/14/2015  . Zoster 01/13/2009    Health Maintenance  Topic Date Due  . INFLUENZA VACCINE  01/16/2017  . COLONOSCOPY  03/06/2022  . TETANUS/TDAP  10/13/2025  . DEXA SCAN  Completed  . PNA vac Low Risk Adult  Completed     Discussed health benefits of physical activity, and encouraged her to engage in regular exercise appropriate for her age and condition.    1. Medicare annual wellness visit, subsequent Normal physical exam today. Will check labs as below and f/u pending lab results. If labs are stable and WNL she will not need to have these rechecked for one year at her next annual physical exam. She is to call the office in  the meantime if she has any acute issue, questions or concerns.  2. Breast cancer screening Breast exam normal today. Patient does perform self breast exams. No family history of breast cancer. Patient declines mammogram this year.   3. Colon cancer screening Last colonoscopy was in 2013, normal with diverticulosis. Repeat if needed in 10 years.   4. Aortic atherosclerosis (HCC) Stable. Patient uses lifestyle modifications. Will check labs as below and f/u pending results. - CBC w/Diff/Platelet - Comprehensive Metabolic Panel (CMET) - Lipid Profile - HgB A1c  5. Chronic obstructive pulmonary disease with acute exacerbation (HCC) Worsening x 3 weeks. Will check labs as below and f/u pending results. Doxycycline sent in for patient.  - CBC w/Diff/Platelet - Comprehensive Metabolic Panel (CMET) - TSH - doxycycline (VIBRA-TABS) 100 MG tablet; Take 1 tablet (100 mg total) by mouth 2 (two) times daily.  Dispense: 20 tablet; Refill: 0  6. Hypercholesteremia Diet controlled. Patient has high HDL. Will check labs as below and f/u pending results. - CBC w/Diff/Platelet - Comprehensive Metabolic Panel (CMET) - Lipid Profile - TSH - HgB A1c  7. Elevated glucose Last glucose was 107. Will check A1c as below.  - CBC w/Diff/Platelet - Comprehensive Metabolic Panel (CMET) - Lipid Profile - TSH - HgB A1c  8. Gastroesophageal reflux disease with esophagitis Stable. Diagnosis pulled for medication refill. Continue current medical treatment plan. - omeprazole (PRILOSEC) 20 MG capsule; Take 1 capsule (20 mg total) by mouth daily.  Dispense: 90 capsule; Refill: 1  9. Dizziness Stable. Diagnosis pulled for medication refill. Continue current medical treatment plan. - meclizine (ANTIVERT) 25 MG tablet; Take 1 tablet (25 mg total) by mouth 3 (three) times daily as needed for dizziness.  Dispense: 30 tablet; Refill: 5  10. BMI 25.0-25.9,adult Counseled patient on healthy lifestyle modifications  including dieting and exercise.   ------------------------------------------------------------------------------------------------------------    Margaretann Loveless, PA-C  [facility]

## 2017-08-12 NOTE — Patient Instructions (Signed)
Health Maintenance for Postmenopausal Women Menopause is a normal process in which your reproductive ability comes to an end. This process happens gradually over a span of months to years, usually between the ages of 22 and 9. Menopause is complete when you have missed 12 consecutive menstrual periods. It is important to talk with your health care provider about some of the most common conditions that affect postmenopausal women, such as heart disease, cancer, and bone loss (osteoporosis). Adopting a healthy lifestyle and getting preventive care can help to promote your health and wellness. Those actions can also lower your chances of developing some of these common conditions. What should I know about menopause? During menopause, you may experience a number of symptoms, such as:  Moderate-to-severe hot flashes.  Night sweats.  Decrease in sex drive.  Mood swings.  Headaches.  Tiredness.  Irritability.  Memory problems.  Insomnia.  Choosing to treat or not to treat menopausal changes is an individual decision that you make with your health care provider. What should I know about hormone replacement therapy and supplements? Hormone therapy products are effective for treating symptoms that are associated with menopause, such as hot flashes and night sweats. Hormone replacement carries certain risks, especially as you become older. If you are thinking about using estrogen or estrogen with progestin treatments, discuss the benefits and risks with your health care provider. What should I know about heart disease and stroke? Heart disease, heart attack, and stroke become more likely as you age. This may be due, in part, to the hormonal changes that your body experiences during menopause. These can affect how your body processes dietary fats, triglycerides, and cholesterol. Heart attack and stroke are both medical emergencies. There are many things that you can do to help prevent heart disease  and stroke:  Have your blood pressure checked at least every 1-2 years. High blood pressure causes heart disease and increases the risk of stroke.  If you are 53-22 years old, ask your health care provider if you should take aspirin to prevent a heart attack or a stroke.  Do not use any tobacco products, including cigarettes, chewing tobacco, or electronic cigarettes. If you need help quitting, ask your health care provider.  It is important to eat a healthy diet and maintain a healthy weight. ? Be sure to include plenty of vegetables, fruits, low-fat dairy products, and lean protein. ? Avoid eating foods that are high in solid fats, added sugars, or salt (sodium).  Get regular exercise. This is one of the most important things that you can do for your health. ? Try to exercise for at least 150 minutes each week. The type of exercise that you do should increase your heart rate and make you sweat. This is known as moderate-intensity exercise. ? Try to do strengthening exercises at least twice each week. Do these in addition to the moderate-intensity exercise.  Know your numbers.Ask your health care provider to check your cholesterol and your blood glucose. Continue to have your blood tested as directed by your health care provider.  What should I know about cancer screening? There are several types of cancer. Take the following steps to reduce your risk and to catch any cancer development as early as possible. Breast Cancer  Practice breast self-awareness. ? This means understanding how your breasts normally appear and feel. ? It also means doing regular breast self-exams. Let your health care provider know about any changes, no matter how small.  If you are 40  or older, have a clinician do a breast exam (clinical breast exam or CBE) every year. Depending on your age, family history, and medical history, it may be recommended that you also have a yearly breast X-ray (mammogram).  If you  have a family history of breast cancer, talk with your health care provider about genetic screening.  If you are at high risk for breast cancer, talk with your health care provider about having an MRI and a mammogram every year.  Breast cancer (BRCA) gene test is recommended for women who have family members with BRCA-related cancers. Results of the assessment will determine the need for genetic counseling and BRCA1 and for BRCA2 testing. BRCA-related cancers include these types: ? Breast. This occurs in males or females. ? Ovarian. ? Tubal. This may also be called fallopian tube cancer. ? Cancer of the abdominal or pelvic lining (peritoneal cancer). ? Prostate. ? Pancreatic.  Cervical, Uterine, and Ovarian Cancer Your health care provider may recommend that you be screened regularly for cancer of the pelvic organs. These include your ovaries, uterus, and vagina. This screening involves a pelvic exam, which includes checking for microscopic changes to the surface of your cervix (Pap test).  For women ages 21-65, health care providers may recommend a pelvic exam and a Pap test every three years. For women ages 79-65, they may recommend the Pap test and pelvic exam, combined with testing for human papilloma virus (HPV), every five years. Some types of HPV increase your risk of cervical cancer. Testing for HPV may also be done on women of any age who have unclear Pap test results.  Other health care providers may not recommend any screening for nonpregnant women who are considered low risk for pelvic cancer and have no symptoms. Ask your health care provider if a screening pelvic exam is right for you.  If you have had past treatment for cervical cancer or a condition that could lead to cancer, you need Pap tests and screening for cancer for at least 20 years after your treatment. If Pap tests have been discontinued for you, your risk factors (such as having a new sexual partner) need to be  reassessed to determine if you should start having screenings again. Some women have medical problems that increase the chance of getting cervical cancer. In these cases, your health care provider may recommend that you have screening and Pap tests more often.  If you have a family history of uterine cancer or ovarian cancer, talk with your health care provider about genetic screening.  If you have vaginal bleeding after reaching menopause, tell your health care provider.  There are currently no reliable tests available to screen for ovarian cancer.  Lung Cancer Lung cancer screening is recommended for adults 69-62 years old who are at high risk for lung cancer because of a history of smoking. A yearly low-dose CT scan of the lungs is recommended if you:  Currently smoke.  Have a history of at least 30 pack-years of smoking and you currently smoke or have quit within the past 15 years. A pack-year is smoking an average of one pack of cigarettes per day for one year.  Yearly screening should:  Continue until it has been 15 years since you quit.  Stop if you develop a health problem that would prevent you from having lung cancer treatment.  Colorectal Cancer  This type of cancer can be detected and can often be prevented.  Routine colorectal cancer screening usually begins at  age 36 and continues through age 79.  If you have risk factors for colon cancer, your health care provider may recommend that you be screened at an earlier age.  If you have a family history of colorectal cancer, talk with your health care provider about genetic screening.  Your health care provider may also recommend using home test kits to check for hidden blood in your stool.  A small camera at the end of a tube can be used to examine your colon directly (sigmoidoscopy or colonoscopy). This is done to check for the earliest forms of colorectal cancer.  Direct examination of the colon should be repeated every  5-10 years until age 62. However, if early forms of precancerous polyps or small growths are found or if you have a family history or genetic risk for colorectal cancer, you may need to be screened more often.  Skin Cancer  Check your skin from head to toe regularly.  Monitor any moles. Be sure to tell your health care provider: ? About any new moles or changes in moles, especially if there is a change in a mole's shape or color. ? If you have a mole that is larger than the size of a pencil eraser.  If any of your family members has a history of skin cancer, especially at a young age, talk with your health care provider about genetic screening.  Always use sunscreen. Apply sunscreen liberally and repeatedly throughout the day.  Whenever you are outside, protect yourself by wearing long sleeves, pants, a wide-brimmed hat, and sunglasses.  What should I know about osteoporosis? Osteoporosis is a condition in which bone destruction happens more quickly than new bone creation. After menopause, you may be at an increased risk for osteoporosis. To help prevent osteoporosis or the bone fractures that can happen because of osteoporosis, the following is recommended:  If you are 49-101 years old, get at least 1,000 mg of calcium and at least 600 mg of vitamin D per day.  If you are older than age 50 but younger than age 52, get at least 1,200 mg of calcium and at least 600 mg of vitamin D per day.  If you are older than age 22, get at least 1,200 mg of calcium and at least 800 mg of vitamin D per day.  Smoking and excessive alcohol intake increase the risk of osteoporosis. Eat foods that are rich in calcium and vitamin D, and do weight-bearing exercises several times each week as directed by your health care provider. What should I know about how menopause affects my mental health? Depression may occur at any age, but it is more common as you become older. Common symptoms of depression  include:  Low or sad mood.  Changes in sleep patterns.  Changes in appetite or eating patterns.  Feeling an overall lack of motivation or enjoyment of activities that you previously enjoyed.  Frequent crying spells.  Talk with your health care provider if you think that you are experiencing depression. What should I know about immunizations? It is important that you get and maintain your immunizations. These include:  Tetanus, diphtheria, and pertussis (Tdap) booster vaccine.  Influenza every year before the flu season begins.  Pneumonia vaccine.  Shingles vaccine.  Your health care provider may also recommend other immunizations. This information is not intended to replace advice given to you by your health care provider. Make sure you discuss any questions you have with your health care provider. Document Released: 07/27/2005  Document Revised: 12/23/2015 Document Reviewed: 03/08/2015 Elsevier Interactive Patient Education  [employer].

## 2017-11-21 ENCOUNTER — Telehealth: Payer: Self-pay

## 2017-11-21 LAB — CBC WITH DIFFERENTIAL/PLATELET
Basophils Absolute: 0 10*3/uL (ref 0.0–0.2)
Basos: 1 %
EOS (ABSOLUTE): 0.1 10*3/uL (ref 0.0–0.4)
EOS: 2 %
HEMOGLOBIN: 12.1 g/dL (ref 11.1–15.9)
Hematocrit: 36.6 % (ref 34.0–46.6)
IMMATURE GRANS (ABS): 0 10*3/uL (ref 0.0–0.1)
IMMATURE GRANULOCYTES: 0 %
LYMPHS ABS: 1.3 10*3/uL (ref 0.7–3.1)
LYMPHS: 28 %
MCH: 30.5 pg (ref 26.6–33.0)
MCHC: 33.1 g/dL (ref 31.5–35.7)
MCV: 92 fL (ref 79–97)
MONOCYTES: 9 %
Monocytes Absolute: 0.4 10*3/uL (ref 0.1–0.9)
NEUTROS PCT: 60 %
Neutrophils Absolute: 2.7 10*3/uL (ref 1.4–7.0)
Platelets: 253 10*3/uL (ref 150–450)
RBC: 3.97 x10E6/uL (ref 3.77–5.28)
RDW: 13.7 % (ref 12.3–15.4)
WBC: 4.5 10*3/uL (ref 3.4–10.8)

## 2017-11-21 LAB — LIPID PANEL
CHOL/HDL RATIO: 4 ratio (ref 0.0–4.4)
CHOLESTEROL TOTAL: 231 mg/dL — AB (ref 100–199)
HDL: 58 mg/dL (ref >39)
LDL CALC: 142 mg/dL — AB (ref 0–99)
TRIGLYCERIDES: 155 mg/dL — AB (ref 0–149)
VLDL CHOLESTEROL CAL: 31 mg/dL (ref 5–40)

## 2017-11-21 LAB — COMPREHENSIVE METABOLIC PANEL
ALBUMIN: 3.8 g/dL (ref 3.5–4.8)
ALK PHOS: 72 IU/L (ref 39–117)
ALT: 16 IU/L (ref 0–32)
AST: 18 IU/L (ref 0–40)
Albumin/Globulin Ratio: 1.6 (ref 1.2–2.2)
BUN/Creatinine Ratio: 15 (ref 12–28)
BUN: 13 mg/dL (ref 8–27)
Bilirubin Total: 0.2 mg/dL (ref 0.0–1.2)
CALCIUM: 9.3 mg/dL (ref 8.7–10.3)
CO2: 24 mmol/L (ref 20–29)
CREATININE: 0.88 mg/dL (ref 0.57–1.00)
Chloride: 107 mmol/L — ABNORMAL HIGH (ref 96–106)
GFR calc Af Amer: 74 mL/min/{1.73_m2} (ref >59)
GFR, EST NON AFRICAN AMERICAN: 64 mL/min/{1.73_m2} (ref >59)
GLUCOSE: 89 mg/dL (ref 65–99)
Globulin, Total: 2.4 g/dL (ref 1.5–4.5)
Potassium: 4.3 mmol/L (ref 3.5–5.2)
Sodium: 144 mmol/L (ref 134–144)
Total Protein: 6.2 g/dL (ref 6.0–8.5)

## 2017-11-21 LAB — TSH: TSH: 2.98 u[IU]/mL (ref 0.450–4.500)

## 2017-11-21 LAB — HEMOGLOBIN A1C
Est. average glucose Bld gHb Est-mCnc: 108 mg/dL
Hgb A1c MFr Bld: 5.4 % (ref 4.8–5.6)

## 2017-11-21 NOTE — Telephone Encounter (Signed)
-----   Message from Margaretann LovelessJennifer M Burnette, PA-C sent at 11/21/2017  9:13 AM EDT ----- All labs are within normal limits and stable.  Cholesterol improving. Thanks! -JB

## 2017-11-21 NOTE — Telephone Encounter (Signed)
Patient advised as below.  

## 2017-12-26 ENCOUNTER — Encounter: Payer: Self-pay | Admitting: *Deleted

## 2017-12-31 ENCOUNTER — Other Ambulatory Visit: Payer: Self-pay

## 2017-12-31 ENCOUNTER — Ambulatory Visit
Admission: RE | Admit: 2017-12-31 | Discharge: 2017-12-31 | Disposition: A | Discharge status: Home / Self Care | Payer: Medicare Other | Source: Ambulatory Visit | Attending: Ophthalmology | Admitting: Ophthalmology

## 2017-12-31 ENCOUNTER — Encounter: Payer: Self-pay | Admitting: *Deleted

## 2017-12-31 ENCOUNTER — Encounter
Admission: RE | Disposition: A | Discharge status: Home / Self Care | Payer: Self-pay | Source: Ambulatory Visit | Attending: Ophthalmology

## 2017-12-31 ENCOUNTER — Ambulatory Visit: Payer: Medicare Other | Admitting: Anesthesiology

## 2017-12-31 DIAGNOSIS — K219 Gastro-esophageal reflux disease without esophagitis: Secondary | ICD-10-CM | POA: Insufficient documentation

## 2017-12-31 DIAGNOSIS — Z79899 Other long term (current) drug therapy: Secondary | ICD-10-CM | POA: Insufficient documentation

## 2017-12-31 DIAGNOSIS — E7800 Pure hypercholesterolemia, unspecified: Secondary | ICD-10-CM | POA: Diagnosis not present

## 2017-12-31 DIAGNOSIS — Z87891 Personal history of nicotine dependence: Secondary | ICD-10-CM | POA: Diagnosis not present

## 2017-12-31 DIAGNOSIS — F419 Anxiety disorder, unspecified: Secondary | ICD-10-CM | POA: Diagnosis not present

## 2017-12-31 DIAGNOSIS — H2511 Age-related nuclear cataract, right eye: Secondary | ICD-10-CM | POA: Diagnosis present

## 2017-12-31 HISTORY — DX: Gastro-esophageal reflux disease without esophagitis: K21.9

## 2017-12-31 HISTORY — DX: Anxiety disorder, unspecified: F41.9

## 2017-12-31 HISTORY — PX: CATARACT EXTRACTION W/PHACO: SHX586

## 2017-12-31 SURGERY — PHACOEMULSIFICATION, CATARACT, WITH IOL INSERTION
Anesthesia: Monitor Anesthesia Care | Site: Eye | Laterality: Right | Wound class: "Clean "

## 2017-12-31 MED ORDER — PHENYLEPHRINE HCL 10 % OP SOLN
OPHTHALMIC | Status: AC
  Administered 2017-12-31: 1 [drp] via OPHTHALMIC
  Filled 2017-12-31: qty 5

## 2017-12-31 MED ORDER — CYCLOPENTOLATE HCL 2 % OP SOLN
1.0000 [drp] | OPHTHALMIC | Status: DC | PRN
  Administered 2017-12-31 (×4): 1 [drp] via OPHTHALMIC

## 2017-12-31 MED ORDER — PHENYLEPHRINE HCL 10 % OP SOLN
1.0000 [drp] | OPHTHALMIC | Status: DC | PRN
  Administered 2017-12-31 (×4): 1 [drp] via OPHTHALMIC

## 2017-12-31 MED ORDER — FENTANYL CITRATE (PF) 100 MCG/2ML IJ SOLN: 25.0000 ug | INTRAMUSCULAR | Status: DC | PRN

## 2017-12-31 MED ORDER — NA CHONDROIT SULF-NA HYALURON 40-17 MG/ML IO SOLN
INTRAOCULAR | Status: DC | PRN
  Administered 2017-12-31: 1 mL via INTRAOCULAR

## 2017-12-31 MED ORDER — MOXIFLOXACIN HCL 0.5 % OP SOLN: 1.0000 [drp] | OPHTHALMIC | Status: DC | PRN

## 2017-12-31 MED ORDER — MOXIFLOXACIN HCL 0.5 % OP SOLN
OPHTHALMIC | Status: AC
  Filled 2017-12-31: qty 3

## 2017-12-31 MED ORDER — POVIDONE-IODINE 5 % OP SOLN
OPHTHALMIC | Status: AC
  Filled 2017-12-31: qty 30

## 2017-12-31 MED ORDER — MIDAZOLAM HCL 2 MG/2ML IJ SOLN
INTRAMUSCULAR | Status: DC | PRN
  Administered 2017-12-31: 2 mg via INTRAVENOUS

## 2017-12-31 MED ORDER — EPINEPHRINE PF 1 MG/ML IJ SOLN
INTRAMUSCULAR | Status: AC
  Filled 2017-12-31: qty 1

## 2017-12-31 MED ORDER — CARBACHOL 0.01 % IO SOLN
INTRAOCULAR | Status: DC | PRN
  Administered 2017-12-31: .5 mL via INTRAOCULAR

## 2017-12-31 MED ORDER — TETRACAINE HCL 0.5 % OP SOLN
1.0000 [drp] | OPHTHALMIC | Status: DC | PRN
  Administered 2017-12-31 (×2): 1 [drp] via OPHTHALMIC

## 2017-12-31 MED ORDER — EPINEPHRINE PF 1 MG/ML IJ SOLN
INTRAOCULAR | Status: DC | PRN
  Administered 2017-12-31: 1 mL via OPHTHALMIC

## 2017-12-31 MED ORDER — LIDOCAINE HCL (PF) 4 % IJ SOLN
INTRAMUSCULAR | Status: AC
  Filled 2017-12-31: qty 5

## 2017-12-31 MED ORDER — MOXIFLOXACIN HCL 0.5 % OP SOLN
OPHTHALMIC | Status: DC | PRN
  Administered 2017-12-31: .2 mL via OPHTHALMIC

## 2017-12-31 MED ORDER — POVIDONE-IODINE 5 % OP SOLN
OPHTHALMIC | Status: DC | PRN
  Administered 2017-12-31: 1 via OPHTHALMIC

## 2017-12-31 MED ORDER — TETRACAINE HCL 0.5 % OP SOLN
OPHTHALMIC | Status: AC
  Administered 2017-12-31: 1 [drp] via OPHTHALMIC
  Filled 2017-12-31: qty 4

## 2017-12-31 MED ORDER — CYCLOPENTOLATE HCL 2 % OP SOLN
OPHTHALMIC | Status: AC
  Administered 2017-12-31: 1 [drp] via OPHTHALMIC
  Filled 2017-12-31: qty 2

## 2017-12-31 MED ORDER — ONDANSETRON HCL 4 MG/2ML IJ SOLN: 4.0000 mg | Freq: Once | INTRAMUSCULAR | Status: DC | PRN

## 2017-12-31 MED ORDER — SODIUM CHLORIDE 0.9 % IV SOLN
INTRAVENOUS | Status: DC
  Administered 2017-12-31: 08:00:00 via INTRAVENOUS

## 2017-12-31 MED ORDER — NA CHONDROIT SULF-NA HYALURON 40-17 MG/ML IO SOLN
INTRAOCULAR | Status: AC
  Filled 2017-12-31: qty 1

## 2017-12-31 MED ORDER — MIDAZOLAM HCL 2 MG/2ML IJ SOLN
INTRAMUSCULAR | Status: AC
  Filled 2017-12-31: qty 2

## 2017-12-31 MED ORDER — LIDOCAINE HCL (PF) 4 % IJ SOLN
INTRAOCULAR | Status: DC | PRN
  Administered 2017-12-31: 2 mL via OPHTHALMIC

## 2017-12-31 SURGICAL SUPPLY — 16 items
GLOVE BIO SURGEON STRL SZ8 (GLOVE) ×2 IMPLANT
GLOVE BIOGEL M 6.5 STRL (GLOVE) ×2 IMPLANT
GLOVE SURG LX 8.0 MICRO (GLOVE) ×1
GLOVE SURG LX STRL 8.0 MICRO (GLOVE) ×1 IMPLANT
GOWN STRL REUS W/ TWL LRG LVL3 (GOWN DISPOSABLE) ×2 IMPLANT
GOWN STRL REUS W/TWL LRG LVL3 (GOWN DISPOSABLE) ×2
LABEL CATARACT MEDS ST (LABEL) ×2 IMPLANT
LENS IOL TECNIS ITEC 23.0 (Intraocular Lens) ×1 IMPLANT
PACK CATARACT (MISCELLANEOUS) ×2 IMPLANT
PACK CATARACT BRASINGTON LX (MISCELLANEOUS) ×2 IMPLANT
PACK EYE AFTER SURG (MISCELLANEOUS) ×2 IMPLANT
SOL BSS BAG (MISCELLANEOUS) ×2
SOLUTION BSS BAG (MISCELLANEOUS) ×1 IMPLANT
SYR 5ML LL (SYRINGE) ×2 IMPLANT
WATER STERILE IRR 250ML POUR (IV SOLUTION) ×2 IMPLANT
WIPE NON LINTING 3.25X3.25 (MISCELLANEOUS) ×2 IMPLANT

## 2017-12-31 NOTE — Discharge Instructions (Signed)
Eye Surgery Discharge Instructions    Expect mild scratchy sensation or mild soreness. DO NOT RUB YOUR EYE!  The day of surgery:  Minimal physical activity, but bed rest is not required  No reading, computer work, or close hand work  No bending, lifting, or straining.  May watch TV  For 24 hours:  No driving, legal decisions, or alcoholic beverages  Safety precautions  Eat anything you prefer: It is better to start with liquids, then soup then solid foods.  _____ Eye patch should be worn until postoperative exam tomorrow.  ____ Solar shield eyeglasses should be worn for comfort in the sunlight/patch while sleeping  Resume all regular medications including aspirin or Coumadin if these were discontinued prior to surgery. You may shower, bathe, shave, or wash your hair. Tylenol may be taken for mild discomfort.  Call your doctor if you experience significant pain, nausea, or vomiting, fever > 101 or other signs of infection. [phone] or [phone] Specific instructions:  Follow-up Information    Galen ManilaPorfilio, William, MD Follow up.   Specialty:  Ophthalmology Why:  July 17 at 9:40am Contact information: [address] [place] [address] [phone]

## 2017-12-31 NOTE — Transfer of Care (Signed)
Immediate Anesthesia Transfer of Care Note  Patient: Tasha Wallace  Procedure(s) Performed: CATARACT EXTRACTION PHACO AND INTRAOCULAR LENS PLACEMENT (IOC) (Right Eye)  Patient Location: PACU  Anesthesia Type:MAC  Level of Consciousness: awake, alert  and oriented  Airway & Oxygen Therapy: Patient Spontanous Breathing  Post-op Assessment: Report given to RN and Post -op Vital signs reviewed and stable  Post vital signs: Reviewed and stable  Last Vitals:  Vitals Value Taken Time  BP 148/71 12/31/2017  8:42 AM  Temp 36.6 C 12/31/2017  8:42 AM  Pulse 61 12/31/2017  8:42 AM  Resp 18 12/31/2017  8:42 AM  SpO2 100 % 12/31/2017  8:42 AM    Last Pain:  Vitals:   12/31/17 0842  TempSrc: Oral  PainSc: 0-No pain         Complications: No apparent anesthesia complications

## 2017-12-31 NOTE — Anesthesia Preprocedure Evaluation (Signed)
Anesthesia Evaluation  Patient identified by MRN, date of birth, ID band Patient awake    Reviewed: Allergy & Precautions, H&P , NPO status , Patient's Chart, lab work & pertinent test results, reviewed documented beta blocker date and time   Airway Mallampati: II  TM Distance: >3 FB Neck ROM: full    Dental no notable dental hx. (+) Teeth Intact   Pulmonary neg pulmonary ROS, COPD, former smoker,    Pulmonary exam normal breath sounds clear to auscultation       Cardiovascular Exercise Tolerance: Good hypertension, + Peripheral Vascular Disease  negative cardio ROS   Rhythm:regular Rate:Normal     Neuro/Psych PSYCHIATRIC DISORDERS Anxiety negative neurological ROS  negative psych ROS   GI/Hepatic negative GI ROS, Neg liver ROS, GERD  Medicated,  Endo/Other  negative endocrine ROSdiabetes  Renal/GU      Musculoskeletal   Abdominal   Peds  Hematology negative hematology ROS (+)   Anesthesia Other Findings   Reproductive/Obstetrics negative OB ROS                             Anesthesia Physical Anesthesia Plan  ASA: II  Anesthesia Plan: MAC   Post-op Pain Management:    Induction:   PONV Risk Score and Plan:   Airway Management Planned:   Additional Equipment:   Intra-op Plan:   Post-operative Plan:   Informed Consent: I have reviewed the patients History and Physical, chart, labs and discussed the procedure including the risks, benefits and alternatives for the proposed anesthesia with the patient or authorized representative who has indicated his/her understanding and acceptance.     Plan Discussed with: CRNA  Anesthesia Plan Comments:         Anesthesia Quick Evaluation

## 2017-12-31 NOTE — H&P (Signed)
All labs reviewed. Abnormal studies sent to patients PCP when indicated.  Previous H&P reviewed, patient examined, there are NO CHANGES.  Tasha Peyton Porfilio7/16/20198:15 AM

## 2017-12-31 NOTE — Op Note (Signed)
PREOPERATIVE DIAGNOSIS:  Nuclear sclerotic cataract of the right eye.   POSTOPERATIVE DIAGNOSIS:  nuclear sclerotic cataract right eye   OPERATIVE PROCEDURE: Procedure(s): CATARACT EXTRACTION PHACO AND INTRAOCULAR LENS PLACEMENT (IOC)   SURGEON:  Galen ManilaWilliam Adraine Biffle, MD.   ANESTHESIA:  Anesthesiologist: Yevette EdwardsAdams, James G, MD CRNA: Ginger CarneMichelet, Stephanie, CRNA; Junious SilkNoles, Mark, CRNA  1.      Managed anesthesia care. 2.      0.431ml of Shugarcaine was instilled in the eye following the paracentesis.   COMPLICATIONS:  None.   TECHNIQUE:   Stop and chop   DESCRIPTION OF PROCEDURE:  The patient was examined and consented in the preoperative holding area where the aforementioned topical anesthesia was applied to the right eye and then brought back to the Operating Room where the right eye was prepped and draped in the usual sterile ophthalmic fashion and a lid speculum was placed. A paracentesis was created with the side port blade and the anterior chamber was filled with viscoelastic. A near clear corneal incision was performed with the steel keratome. A continuous curvilinear capsulorrhexis was performed with a cystotome followed by the capsulorrhexis forceps. Hydrodissection and hydrodelineation were carried out with BSS on a blunt cannula. The lens was removed in a stop and chop  technique and the remaining cortical material was removed with the irrigation-aspiration handpiece. The capsular bag was inflated with viscoelastic and the Technis ZCB00  lens was placed in the capsular bag without complication. The remaining viscoelastic was removed from the eye with the irrigation-aspiration handpiece. The wounds were hydrated. The anterior chamber was flushed with Miostat and the eye was inflated to physiologic pressure. 0.221ml of Vigamox was placed in the anterior chamber. The wounds were found to be water tight. The eye was dressed with Vigamox. The patient was given protective glasses to wear throughout the day and  a shield with which to sleep tonight. The patient was also given drops with which to begin a drop regimen today and will follow-up with me in one day. Implant Name Type Inv. Item Serial No. Manufacturer Lot No. LRB No. Used  LENS IOL DIOP 23.0 - [id number] Intraocular Lens LENS IOL DIOP 23.0 [id number]  Right 1   Procedure(s) with comments: CATARACT EXTRACTION PHACO AND INTRAOCULAR LENS PLACEMENT ([facility]) (Right) - US 00:43.2 AP% 16.0 CDE 6.91 Fluid pack lot # [id number]  Electronically signed: Galen ManilaWilliam Chamar Broughton 12/31/2017 8:39 AM

## 2017-12-31 NOTE — Anesthesia Procedure Notes (Signed)
Date/Time: 12/31/2017 8:32 AM Performed by: Junious SilkNoles, Luka Reisch, CRNA Pre-anesthesia Checklist: Patient identified, Emergency Drugs available, Suction available, Patient being monitored and Timeout performed Oxygen Delivery Method: Nasal cannula

## 2017-12-31 NOTE — Anesthesia Post-op Follow-up Note (Signed)
Anesthesia QCDR form completed.        

## 2018-01-01 NOTE — Anesthesia Postprocedure Evaluation (Signed)
Anesthesia Post Note  Patient: Tasha Wallace  Procedure(s) Performed: CATARACT EXTRACTION PHACO AND INTRAOCULAR LENS PLACEMENT ([facility]) (Right Eye)  Patient location during evaluation: PACU Anesthesia Type: MAC Level of consciousness: awake and alert Pain management: pain level controlled Vital Signs Assessment: post-procedure vital signs reviewed and stable Respiratory status: spontaneous breathing, nonlabored ventilation, respiratory function stable and patient connected to nasal cannula oxygen Cardiovascular status: blood pressure returned to baseline and stable Postop Assessment: no apparent nausea or vomiting Anesthetic complications: no     Last Vitals:  Vitals:   12/31/17 0719 12/31/17 0842  BP: (!) 155/69 (!) 148/71  Pulse: (!) 58 61  Resp: 18 18  Temp: 37.1 C 36.6 C  SpO2: 98% 100%    Last Pain:  Vitals:   12/31/17 0842  TempSrc: Oral  PainSc: 0-No pain                 Molli Barrows

## 2018-01-15 ENCOUNTER — Other Ambulatory Visit: Payer: Self-pay | Admitting: Physician Assistant

## 2018-01-15 DIAGNOSIS — K2100 Gastro-esophageal reflux disease with esophagitis, without bleeding: Secondary | ICD-10-CM

## 2018-01-27 ENCOUNTER — Encounter: Payer: Self-pay | Admitting: *Deleted

## 2018-02-04 ENCOUNTER — Encounter
Admission: RE | Disposition: A | Discharge status: Home / Self Care | Payer: Self-pay | Source: Ambulatory Visit | Attending: Ophthalmology

## 2018-02-04 ENCOUNTER — Ambulatory Visit: Payer: Medicare Other | Admitting: Anesthesiology

## 2018-02-04 ENCOUNTER — Ambulatory Visit
Admission: RE | Admit: 2018-02-04 | Discharge: 2018-02-04 | Disposition: A | Discharge status: Home / Self Care | Payer: Medicare Other | Source: Ambulatory Visit | Attending: Ophthalmology | Admitting: Ophthalmology

## 2018-02-04 ENCOUNTER — Other Ambulatory Visit: Payer: Self-pay

## 2018-02-04 DIAGNOSIS — H2512 Age-related nuclear cataract, left eye: Secondary | ICD-10-CM | POA: Diagnosis present

## 2018-02-04 DIAGNOSIS — J449 Chronic obstructive pulmonary disease, unspecified: Secondary | ICD-10-CM | POA: Insufficient documentation

## 2018-02-04 DIAGNOSIS — K219 Gastro-esophageal reflux disease without esophagitis: Secondary | ICD-10-CM | POA: Diagnosis not present

## 2018-02-04 DIAGNOSIS — Z87891 Personal history of nicotine dependence: Secondary | ICD-10-CM | POA: Insufficient documentation

## 2018-02-04 HISTORY — PX: CATARACT EXTRACTION W/PHACO: SHX586

## 2018-02-04 SURGERY — PHACOEMULSIFICATION, CATARACT, WITH IOL INSERTION
Anesthesia: Monitor Anesthesia Care | Site: Eye | Laterality: Left | Wound class: "Clean "

## 2018-02-04 MED ORDER — EPINEPHRINE PF 1 MG/ML IJ SOLN
INTRAMUSCULAR | Status: AC
  Filled 2018-02-04: qty 2

## 2018-02-04 MED ORDER — MOXIFLOXACIN HCL 0.5 % OP SOLN
OPHTHALMIC | Status: AC
  Filled 2018-02-04: qty 3

## 2018-02-04 MED ORDER — POVIDONE-IODINE 5 % OP SOLN
OPHTHALMIC | Status: DC | PRN
  Administered 2018-02-04: 1 via OPHTHALMIC

## 2018-02-04 MED ORDER — MIDAZOLAM HCL 2 MG/2ML IJ SOLN
INTRAMUSCULAR | Status: DC | PRN
  Administered 2018-02-04: 2 mg via INTRAVENOUS

## 2018-02-04 MED ORDER — MIDAZOLAM HCL 2 MG/2ML IJ SOLN
INTRAMUSCULAR | Status: AC
  Filled 2018-02-04: qty 2

## 2018-02-04 MED ORDER — LIDOCAINE HCL (PF) 4 % IJ SOLN
INTRAMUSCULAR | Status: AC
  Filled 2018-02-04: qty 5

## 2018-02-04 MED ORDER — SODIUM CHLORIDE 0.9 % IV SOLN
INTRAVENOUS | Status: DC
  Administered 2018-02-04 (×2): via INTRAVENOUS

## 2018-02-04 MED ORDER — MOXIFLOXACIN HCL 0.5 % OP SOLN: 1.0000 [drp] | OPHTHALMIC | Status: DC | PRN

## 2018-02-04 MED ORDER — ARMC OPHTHALMIC DILATING DROPS
OPHTHALMIC | Status: AC
  Filled 2018-02-04: qty 0.5

## 2018-02-04 MED ORDER — EPINEPHRINE PF 1 MG/ML IJ SOLN
INTRAMUSCULAR | Status: DC | PRN
  Administered 2018-02-04: 12:00:00 via OPHTHALMIC

## 2018-02-04 MED ORDER — POVIDONE-IODINE 5 % OP SOLN
OPHTHALMIC | Status: AC
  Filled 2018-02-04: qty 30

## 2018-02-04 MED ORDER — ARMC OPHTHALMIC DILATING DROPS
1.0000 "application " | OPHTHALMIC | Status: AC
  Administered 2018-02-04 (×3): 1 via OPHTHALMIC

## 2018-02-04 MED ORDER — FENTANYL CITRATE (PF) 100 MCG/2ML IJ SOLN
INTRAMUSCULAR | Status: AC
  Filled 2018-02-04: qty 2

## 2018-02-04 MED ORDER — NA CHONDROIT SULF-NA HYALURON 40-17 MG/ML IO SOLN
INTRAOCULAR | Status: AC
  Filled 2018-02-04: qty 1

## 2018-02-04 MED ORDER — NA CHONDROIT SULF-NA HYALURON 40-17 MG/ML IO SOLN
INTRAOCULAR | Status: DC | PRN
  Administered 2018-02-04: 1 mL via INTRAOCULAR

## 2018-02-04 MED ORDER — LIDOCAINE HCL (PF) 4 % IJ SOLN
INTRAOCULAR | Status: DC | PRN
  Administered 2018-02-04: 4 mL via OPHTHALMIC

## 2018-02-04 MED ORDER — FENTANYL CITRATE (PF) 100 MCG/2ML IJ SOLN
INTRAMUSCULAR | Status: DC | PRN
  Administered 2018-02-04: 100 ug via INTRAVENOUS

## 2018-02-04 MED ORDER — MOXIFLOXACIN HCL 0.5 % OP SOLN
OPHTHALMIC | Status: DC | PRN
  Administered 2018-02-04: 0.2 mL via OPHTHALMIC

## 2018-02-04 MED ORDER — CARBACHOL 0.01 % IO SOLN
INTRAOCULAR | Status: DC | PRN
  Administered 2018-02-04: 0.5 mL via INTRAOCULAR

## 2018-02-04 SURGICAL SUPPLY — 16 items
GLOVE BIO SURGEON STRL SZ8 (GLOVE) ×2 IMPLANT
GLOVE BIOGEL M 6.5 STRL (GLOVE) ×2 IMPLANT
GLOVE SURG LX 8.0 MICRO (GLOVE) ×1
GLOVE SURG LX STRL 8.0 MICRO (GLOVE) ×1 IMPLANT
GOWN STRL REUS W/ TWL LRG LVL3 (GOWN DISPOSABLE) ×2 IMPLANT
GOWN STRL REUS W/TWL LRG LVL3 (GOWN DISPOSABLE) ×2
LABEL CATARACT MEDS ST (LABEL) ×2 IMPLANT
LENS IOL TECNIS ITEC 23.5 (Intraocular Lens) ×1 IMPLANT
PACK CATARACT (MISCELLANEOUS) ×2 IMPLANT
PACK CATARACT BRASINGTON LX (MISCELLANEOUS) ×2 IMPLANT
PACK EYE AFTER SURG (MISCELLANEOUS) ×2 IMPLANT
SOL BSS BAG (MISCELLANEOUS) ×2
SOLUTION BSS BAG (MISCELLANEOUS) ×1 IMPLANT
SYR 5ML LL (SYRINGE) ×2 IMPLANT
WATER STERILE IRR 250ML POUR (IV SOLUTION) ×2 IMPLANT
WIPE NON LINTING 3.25X3.25 (MISCELLANEOUS) ×2 IMPLANT

## 2018-02-04 NOTE — Anesthesia Postprocedure Evaluation (Signed)
Anesthesia Post Note  Patient: Tasha Wallace  Procedure(s) Performed: CATARACT EXTRACTION PHACO AND INTRAOCULAR LENS PLACEMENT ([place]) (Left Eye)  Patient location during evaluation: PACU Anesthesia Type: MAC Level of consciousness: awake and alert Pain management: pain level controlled Vital Signs Assessment: post-procedure vital signs reviewed and stable Respiratory status: spontaneous breathing, nonlabored ventilation and respiratory function stable Cardiovascular status: stable and blood pressure returned to baseline Postop Assessment: no apparent nausea or vomiting Anesthetic complications: no     Last Vitals:  Vitals:   02/04/18 1224 02/04/18 1237  BP: 101/88 138/71  Pulse: 80 78  Resp: 16   Temp: 36.4 C   SpO2: 98% 97%    Last Pain:  Vitals:   02/04/18 1237  TempSrc:   PainSc: 0-No pain                 Durenda Hurt

## 2018-02-04 NOTE — Discharge Instructions (Signed)
Eye Surgery Discharge Instructions    Expect mild scratchy sensation or mild soreness. DO NOT RUB YOUR EYE!  The day of surgery:  Minimal physical activity, but bed rest is not required  No reading, computer work, or close hand work  No bending, lifting, or straining.  May watch TV  For 24 hours:  No driving, legal decisions, or alcoholic beverages  Safety precautions  Eat anything you prefer: It is better to start with liquids, then soup then solid foods.  _____ Eye patch should be worn until postoperative exam tomorrow.  ____ Solar shield eyeglasses should be worn for comfort in the sunlight/patch while sleeping  Resume all regular medications including aspirin or Coumadin if these were discontinued prior to surgery. You may shower, bathe, shave, or wash your hair. Tylenol may be taken for mild discomfort.  Call your doctor if you experience significant pain, nausea, or vomiting, fever > 101 or other signs of infection. [phone] or [phone] Specific instructions:  Follow-up Information    Galen ManilaPorfilio, William, MD Follow up.   Specialty:  Ophthalmology Why:  August 21 at 11:00am Contact information: [address] [place] [address] [phone]

## 2018-02-04 NOTE — Transfer of Care (Signed)
Immediate Anesthesia Transfer of Care Note  Patient: Tasha Wallace  Procedure(s) Performed: CATARACT EXTRACTION PHACO AND INTRAOCULAR LENS PLACEMENT (I[facility]) (Left Eye)  Patient Location: PACU  Anesthesia Type:MAC  Level of Consciousness: awake  Airway & Oxygen Therapy: Patient Spontanous Breathing  Post-op Assessment: Report given to RN  Post vital signs: stable  Last Vitals:  Vitals Value Taken Time  BP    Temp    Pulse    Resp    SpO2      Last Pain:  Vitals:   02/04/18 1109  PainSc: 0-No pain         Complications: No apparent anesthesia complications

## 2018-02-04 NOTE — H&P (Signed)
All labs reviewed. Abnormal studies sent to patients PCP when indicated.  Previous H&P reviewed, patient examined, there are NO CHANGES.  Tasha Cillo Porfilio8/20/201911:30 AM

## 2018-02-04 NOTE — Anesthesia Post-op Follow-up Note (Signed)
Anesthesia QCDR form completed.        

## 2018-02-04 NOTE — Op Note (Signed)
PREOPERATIVE DIAGNOSIS:  Nuclear sclerotic cataract of the left eye.   POSTOPERATIVE DIAGNOSIS:  Nuclear sclerotic cataract of the left eye.   OPERATIVE PROCEDURE: Procedure(s): CATARACT EXTRACTION PHACO AND INTRAOCULAR LENS PLACEMENT ([facility])   SURGEON:  Galen ManilaWilliam Nadiah Corbit, MD.   ANESTHESIA:  Anesthesiologist: Jovita GammaFitzgerald, Kathryn L, MD CRNA: Camille BalSandstrom, Tony O, CRNA  1.      Managed anesthesia care. 2.     0.71ml of Shugarcaine was instilled following the paracentesis   COMPLICATIONS:  None.   TECHNIQUE:   Stop and chop   DESCRIPTION OF PROCEDURE:  The patient was examined and consented in the preoperative holding area where the aforementioned topical anesthesia was applied to the left eye and then brought back to the Operating Room where the left eye was prepped and draped in the usual sterile ophthalmic fashion and a lid speculum was placed. A paracentesis was created with the side port blade and the anterior chamber was filled with viscoelastic. A near clear corneal incision was performed with the steel keratome. A continuous curvilinear capsulorrhexis was performed with a cystotome followed by the capsulorrhexis forceps. Hydrodissection and hydrodelineation were carried out with BSS on a blunt cannula. The lens was removed in a stop and chop  technique and the remaining cortical material was removed with the irrigation-aspiration handpiece. The capsular bag was inflated with viscoelastic and the Technis [id number] lens was placed in the capsular bag without complication. The remaining viscoelastic was removed from the eye with the irrigation-aspiration handpiece. The wounds were hydrated. The anterior chamber was flushed with Miostat and the eye was inflated to physiologic pressure. 0.731ml Vigamox was placed in the anterior chamber. The wounds were found to be water tight. The eye was dressed with Vigamox. The patient was given protective glasses to wear throughout the day and a shield with which to  sleep tonight. The patient was also given drops with which to begin a drop regimen today and will follow-up with me in one day. Implant Name Type Inv. Item Serial No. Manufacturer Lot No. LRB No. Used  LENS IOL DIOP 23.5 - [id number] Intraocular Lens LENS IOL DIOP 23.5 [id number]  Left 1    Procedure(s) with comments: CATARACT EXTRACTION PHACO AND INTRAOCULAR LENS PLACEMENT (IOC) (Left) - US 00:46.6 AP% 17.6 CDE 8.19 Fluid Pack lot # [id number]  Electronically signed: Galen ManilaWilliam Casmir Auguste 02/04/2018 12:22 PM

## 2018-02-04 NOTE — Anesthesia Preprocedure Evaluation (Signed)
Anesthesia Evaluation  Patient identified by MRN, date of birth, ID band Patient awake    Reviewed: Allergy & Precautions, H&P , NPO status , Patient's Chart, lab work & pertinent test results  History of Anesthesia Complications Negative for: history of anesthetic complications  Airway Mallampati: III  TM Distance: >3 FB Neck ROM: full    Dental  (+) Teeth Intact   Pulmonary neg pulmonary ROS, COPD, former smoker,    breath sounds clear to auscultation       Cardiovascular negative cardio ROS   Rhythm:regular Rate:Normal     Neuro/Psych PSYCHIATRIC DISORDERS Anxiety negative neurological ROS  negative psych ROS   GI/Hepatic negative GI ROS, Neg liver ROS, GERD  ,  Endo/Other  negative endocrine ROS  Renal/GU      Musculoskeletal   Abdominal   Peds  Hematology negative hematology ROS (+)   Anesthesia Other Findings Past Medical History: No date: Anxiety No date: GERD (gastroesophageal reflux disease) No date: Hyperlipidemia  Past Surgical History: 12/31/2017: CATARACT EXTRACTION W/PHACO; Right     Comment:  Procedure: CATARACT EXTRACTION PHACO AND INTRAOCULAR               LENS PLACEMENT ([place]);  Surgeon: Birder Robson, MD;                Location: [facility] ORS;  Service: Ophthalmology;  Laterality:              Right;  [place] 00:43.2 AP% 16.0 CDE 6.91 Fluid pack lot #               [id number] No date: colonoscopy 1986: GANGLION CYST EXCISION 1990: [place] CYST 1950: TONSILLECTOMY AND ADENOIDECTOMY 1971: URETHRA SURGERY  BMI    Body Mass Index:  24.28 kg/m      Reproductive/Obstetrics negative OB ROS                             Anesthesia Physical Anesthesia Plan  ASA: II  Anesthesia Plan: MAC   Post-op Pain Management:    Induction:   PONV Risk Score and Plan: Midazolam  Airway Management Planned:   Additional Equipment:   Intra-op  Plan:   Post-operative Plan:   Informed Consent: I have reviewed the patients History and Physical, chart, labs and discussed the procedure including the risks, benefits and alternatives for the proposed anesthesia with the patient or authorized representative who has indicated his/her understanding and acceptance.     Plan Discussed with: Anesthesiologist, CRNA and Surgeon  Anesthesia Plan Comments:         Anesthesia Quick Evaluation

## 2018-04-03 DIAGNOSIS — M79671 Pain in right foot: Secondary | ICD-10-CM | POA: Insufficient documentation

## 2018-06-18 ENCOUNTER — Telehealth: Payer: Self-pay

## 2018-06-18 NOTE — Telephone Encounter (Signed)
Call pt regarding lung screening. Left message for pt to return call.  

## 2018-06-21 ENCOUNTER — Telehealth: Payer: Self-pay

## 2018-06-21 NOTE — Telephone Encounter (Signed)
Call pt regarding lung screening. Pt is a former smoker. Pt would like scan any day in the afternoon. Pt has no new health issues.

## 2018-06-23 ENCOUNTER — Telehealth: Payer: Self-pay | Admitting: *Deleted

## 2018-06-23 DIAGNOSIS — Z122 Encounter for screening for malignant neoplasm of respiratory organs: Secondary | ICD-10-CM

## 2018-06-23 NOTE — Telephone Encounter (Signed)
Patient has been notified that the annual lung cancer screening low dose CT scan is due currently or will be in the near future.  Confirmed that the patient is within the age range of 14-80, and asymptomatic, and currently exhibits no signs or symptoms of lung cancer.  Patient denies illness that would prevent curative treatment for lung cancer if found.  Verified smoking history, former smoker with 35pkyr history .  The shared decision making visit was completed on 05-22-17.  Patient is agreeable for the CT scan to be scheduled.  Will call patient back with date and time of appointment.

## 2018-06-24 ENCOUNTER — Telehealth: Payer: Self-pay | Admitting: *Deleted

## 2018-06-24 NOTE — Telephone Encounter (Signed)
Called pt to inform her of her appt for ldct screening on Thursday 07/03/2018 @ 2:15pm here @ [facility], voiced understanding.

## 2018-07-03 ENCOUNTER — Encounter: Payer: Self-pay | Admitting: *Deleted

## 2018-07-03 ENCOUNTER — Ambulatory Visit
Admission: RE | Admit: 2018-07-03 | Discharge: 2018-07-03 | Disposition: A | Discharge status: Home / Self Care | Payer: Medicare Other | Source: Ambulatory Visit | Attending: Nurse Practitioner | Admitting: Nurse Practitioner

## 2018-07-03 DIAGNOSIS — Z122 Encounter for screening for malignant neoplasm of respiratory organs: Secondary | ICD-10-CM | POA: Diagnosis present

## 2018-07-03 DIAGNOSIS — J432 Centrilobular emphysema: Secondary | ICD-10-CM | POA: Insufficient documentation

## 2018-07-03 DIAGNOSIS — I700 Atherosclerosis of aorta: Secondary | ICD-10-CM | POA: Diagnosis not present

## 2018-07-03 DIAGNOSIS — Z87891 Personal history of nicotine dependence: Secondary | ICD-10-CM | POA: Diagnosis not present

## 2018-09-09 ENCOUNTER — Other Ambulatory Visit: Payer: Self-pay

## 2018-09-09 DIAGNOSIS — K2100 Gastro-esophageal reflux disease with esophagitis, without bleeding: Secondary | ICD-10-CM

## 2018-09-09 NOTE — Telephone Encounter (Signed)
L.O.V. 08/12/2017, please advise.

## 2018-09-10 MED ORDER — OMEPRAZOLE 20 MG PO CPDR: 20.0000 mg | DELAYED_RELEASE_CAPSULE | Freq: Every day | ORAL | 1 refills | Status: DC

## 2018-09-22 ENCOUNTER — Encounter: Payer: Medicare Other | Admitting: Physician Assistant

## 2018-12-31 ENCOUNTER — Encounter: Payer: Self-pay | Admitting: Physician Assistant

## 2019-03-27 ENCOUNTER — Encounter: Payer: Medicare Other | Admitting: Physician Assistant

## 2019-05-07 ENCOUNTER — Encounter: Payer: Self-pay | Admitting: Physician Assistant

## 2019-05-07 ENCOUNTER — Other Ambulatory Visit: Payer: Self-pay

## 2019-05-07 ENCOUNTER — Ambulatory Visit (INDEPENDENT_AMBULATORY_CARE_PROVIDER_SITE_OTHER): Payer: Medicare Other | Admitting: Physician Assistant

## 2019-05-07 VITALS — BP 150/80 | HR 83 | Temp 97.3°F | Resp 16 | Ht 66.0 in | Wt 162.0 lb

## 2019-05-07 DIAGNOSIS — I700 Atherosclerosis of aorta: Secondary | ICD-10-CM

## 2019-05-07 DIAGNOSIS — Z0000 Encounter for general adult medical examination without abnormal findings: Secondary | ICD-10-CM | POA: Diagnosis not present

## 2019-05-07 DIAGNOSIS — E7800 Pure hypercholesterolemia, unspecified: Secondary | ICD-10-CM

## 2019-05-07 DIAGNOSIS — J441 Chronic obstructive pulmonary disease with (acute) exacerbation: Secondary | ICD-10-CM

## 2019-05-07 DIAGNOSIS — Z6826 Body mass index (BMI) 26.0-26.9, adult: Secondary | ICD-10-CM

## 2019-05-07 NOTE — Patient Instructions (Addendum)
Health Maintenance After Age 77 After age 77, you are at a higher risk for certain long-term diseases and infections as well as injuries from falls. Falls are a major cause of broken bones and head injuries in people who are older than age 77. Getting regular preventive care can help to keep you healthy and well. Preventive care includes getting regular testing and making lifestyle changes as recommended by your health care provider. Talk with your health care provider about:  Which screenings and tests you should have. A screening is a test that checks for a disease when you have no symptoms.  A diet and exercise plan that is right for you. What should I know about screenings and tests to prevent falls? Screening and testing are the best ways to find a health problem early. Early diagnosis and treatment give you the best chance of managing medical conditions that are common after age 77. Certain conditions and lifestyle choices may make you more likely to have a fall. Your health care provider may recommend:  Regular vision checks. Poor vision and conditions such as cataracts can make you more likely to have a fall. If you wear glasses, make sure to get your prescription updated if your vision changes.  Medicine review. Work with your health care provider to regularly review all of the medicines you are taking, including over-the-counter medicines. Ask your health care provider about any side effects that may make you more likely to have a fall. Tell your health care provider if any medicines that you take make you feel dizzy or sleepy.  Osteoporosis screening. Osteoporosis is a condition that causes the bones to get weaker. This can make the bones weak and cause them to break more easily.  Blood pressure screening. Blood pressure changes and medicines to control blood pressure can make you feel dizzy.  Strength and balance checks. Your health care provider may recommend certain tests to check your  strength and balance while standing, walking, or changing positions.  Foot health exam. Foot pain and numbness, as well as not wearing proper footwear, can make you more likely to have a fall.  Depression screening. You may be more likely to have a fall if you have a fear of falling, feel emotionally low, or feel unable to do activities that you used to do.  Alcohol use screening. Using too much alcohol can affect your balance and may make you more likely to have a fall. What actions can I take to lower my risk of falls? General instructions  Talk with your health care provider about your risks for falling. Tell your health care provider if: ? You fall. Be sure to tell your health care provider about all falls, even ones that seem minor. ? You feel dizzy, sleepy, or off-balance.  Take over-the-counter and prescription medicines only as told by your health care provider. These include any supplements.  Eat a healthy diet and maintain a healthy weight. A healthy diet includes low-fat dairy products, low-fat (lean) meats, and fiber from whole grains, beans, and lots of fruits and vegetables. Home safety  Remove any tripping hazards, such as rugs, cords, and clutter.  Install safety equipment such as grab bars in bathrooms and safety rails on stairs.  Keep rooms and walkways well-lit. Activity   Follow a regular exercise program to stay fit. This will help you maintain your balance. Ask your health care provider what types of exercise are appropriate for you.  If you need a cane or   walker, use it as recommended by your health care provider.  Wear supportive shoes that have nonskid soles. Lifestyle  Do not drink alcohol if your health care provider tells you not to drink.  If you drink alcohol, limit how much you have: ? 0-1 drink a day for women. ? 0-2 drinks a day for men.  Be aware of how much alcohol is in your drink. In the [place] one drink equals one typical bottle of beer (12  oz), one-half glass of wine (5 oz), or one shot of hard liquor (1 oz).  Do not use any products that contain nicotine or tobacco, such as cigarettes and e-cigarettes. If you need help quitting, ask your health care provider. Summary  Having a healthy lifestyle and getting preventive care can help to protect your health and wellness after age 55.  Screening and testing are the best way to find a health problem early and help you avoid having a fall. Early diagnosis and treatment give you the best chance for managing medical conditions that are more common for people who are older than age 4.  Falls are a major cause of broken bones and head injuries in people who are older than age 44. Take precautions to prevent a fall at home.  Work with your health care provider to learn what changes you can make to improve your health and wellness and to prevent falls. This information is not intended to replace advice given to you by your health care provider. Make sure you discuss any questions you have with your health care provider. Document Released: 04/17/2017 Document Revised: 09/25/2018 Document Reviewed: 04/17/2017 Elsevier Patient Education  [place] Cyst A Baker cyst, also called a popliteal cyst, is a sac-like growth that forms at the back of the knee. The cyst forms when the fluid-filled sac (bursa) that cushions the knee joint becomes enlarged. The bursa that becomes a Baker cyst is located at the back of the knee joint. What are the causes? In most cases, a Baker cyst results from another knee problem that causes swelling inside the knee. This makes the fluid inside the knee joint (synovial fluid) flow into the bursa behind the knee, causing the bursa to enlarge. What increases the risk? You may be more likely to develop a Baker cyst if you already have a knee problem, such as:  A tear in cartilage that cushions the knee joint (meniscal tear).  A tear in the tissues  that connect the bones of the knee joint (ligament tear).  Knee swelling from osteoarthritis, rheumatoid arthritis, or gout. What are the signs or symptoms? A Baker cyst does not always cause symptoms. A lump behind the knee may be the only sign of the condition. The lump may be painful, especially when the knee is straightened. If the lump is painful, the pain may come and go. The knee may also be stiff. Symptoms may quickly get more severe if the cyst breaks open (ruptures). If your cyst ruptures, signs and symptoms may affect the knee and the back of the lower leg (calf) and may include:  Sudden or worsening pain.  Swelling.  Bruising. How is this diagnosed? This condition may be diagnosed based on your symptoms and medical history. Your health care provider will also do a physical exam. This may include:  Feeling the cyst to check whether it is tender.  Checking your knee for signs of another knee condition that causes swelling. You may have imaging  tests, such as:  X-rays.  MRI.  Ultrasound. How is this treated? A Baker cyst that is not painful may go away without treatment. If the cyst gets large or painful, it will likely get better if the underlying knee problem is treated. Treatment for a Baker cyst may include:  Resting.  Keeping weight off of the knee. This means not leaning on the knee to support your body weight.  NSAIDs to reduce pain and swelling.  A procedure to drain the fluid from the cyst with a needle (aspiration). You may also get an injection of a medicine that reduces swelling (steroid).  Surgery. This may be needed if other treatments do not work. This usually involves correcting knee damage and removing the cyst. Follow these instructions at home:   Take over-the-counter and prescription medicines only as told by your health care provider.  Rest and return to your normal activities as told by your health care provider. Avoid activities that make  pain or swelling worse. Ask your health care provider what activities are safe for you.  Keep all follow-up visits as told by your health care provider. This is important. Contact a health care provider if:  You have knee pain, stiffness, or swelling that does not get better. Get help right away if:  You have sudden or worsening pain and swelling in your calf area. This information is not intended to replace advice given to you by your health care provider. Make sure you discuss any questions you have with your health care provider. Document Released: 06/04/2005 Document Revised: 05/17/2017 Document Reviewed: 02/23/2016 Elsevier Patient Education  2020 [employer].

## 2019-05-07 NOTE — Progress Notes (Signed)
Patient: Tasha Wallace, Female    DOB: 1940/12/20, 78 y.o.   MRN: [id number] Visit Date: 05/07/2019  Today's Provider: Margaretann Loveless, PA-C   Chief Complaint  Patient presents with  . Annual Exam   Subjective:    I,Tasha Wallace,RMA am acting as a Neurosurgeon for [employer], PA-C.   Annual wellness visit Tasha Wallace is a 78 y.o. female. She feels well. She reports exercising. She reports she is sleeping well. ----------------------------------------------------------- Colonoscopy:03/06/2012-Polyp Hemorrhoids Dr. Mechele Collin recheck in 10 yrs  Review of Systems  Constitutional: Negative.   HENT: Negative.   Eyes: Negative.   Respiratory: Negative.   Cardiovascular: Negative.   Gastrointestinal: Negative.   Endocrine: Negative.   Genitourinary: Negative.   Musculoskeletal: Negative.   Skin: Negative.   Allergic/Immunologic: Negative.   Neurological: Negative.   Hematological: Negative.   Psychiatric/Behavioral: Negative.     Social History   Socioeconomic History  . Marital status: Divorced    Spouse name: Not on file  . Number of children: Not on file  . Years of education: Not on file  . Highest education level: Not on file  Occupational History  . Not on file  Social Needs  . Financial resource strain: Not on file  . Food insecurity    Worry: Not on file    Inability: Not on file  . Transportation needs    Medical: Not on file    Non-medical: Not on file  Tobacco Use  . Smoking status: Former Smoker    Packs/day: 1.00    Years: 35.00    Pack years: 35.00    Types: Cigarettes    Quit date: 06/17/2012    Years since quitting: 6.8  . Smokeless tobacco: Never Used  Substance and Sexual Activity  . Alcohol use: Yes    Alcohol/week: 2.0 standard drinks    Types: 2 Shots of liquor per week  . Drug use: No  . Sexual activity: Not on file  Lifestyle  . Physical activity    Days per week: Not on file    Minutes per session: Not on file   . Stress: Not on file  Relationships  . Social Musician on phone: Not on file    Gets together: Not on file    Attends religious service: Not on file    Active member of club or organization: Not on file    Attends meetings of clubs or organizations: Not on file    Relationship status: Not on file  . Intimate partner violence    Fear of current or ex partner: Not on file    Emotionally abused: Not on file    Physically abused: Not on file    Forced sexual activity: Not on file  Other Topics Concern  . Not on file  Social History Narrative  . Not on file    Past Medical History:  Diagnosis Date  . Anxiety   . GERD (gastroesophageal reflux disease)   . Hyperlipidemia      Patient Active Problem List   Diagnosis Date Noted  . COPD (chronic obstructive pulmonary disease) (HCC) 07/08/2017  . Aortic atherosclerosis (HCC) 07/08/2017  . Cystitis 06/15/2015  . CD (contact dermatitis) 06/10/2015  . Blood in the urine 06/10/2015  . Osteopenia 06/10/2015  . Esophagitis, reflux 11/12/2008  . Hypercholesteremia 11/12/2008  . ADD (attention deficit disorder) 11/03/2002  . Adaptation reaction 11/03/2002    Past Surgical History:  Procedure Laterality Date  . CATARACT EXTRACTION W/PHACO Right 12/31/2017   Procedure: CATARACT EXTRACTION PHACO AND INTRAOCULAR LENS PLACEMENT (IOC);  Surgeon: Birder Robson, MD;  Location: [facility] ORS;  Service: Ophthalmology;  Laterality: Right;  [place] 00:43.2 AP% 16.0 CDE 6.91 Fluid pack lot # [id number]  . CATARACT EXTRACTION W/PHACO Left 02/04/2018   Procedure: CATARACT EXTRACTION PHACO AND INTRAOCULAR LENS PLACEMENT (IOC);  Surgeon: Birder Robson, MD;  Location: [facility] ORS;  Service: Ophthalmology;  Laterality: Left;  [place] 00:46.6 AP% 17.6 CDE 8.19 Fluid Pack lot # [id number]  . colonoscopy    . GANGLION CYST EXCISION  1986  . IRRIGATION AND DEBRIDEMENT SEBACEOUS CYST  1990  . TONSILLECTOMY AND ADENOIDECTOMY  1950  . [place]     Her family history includes CVA in her mother; Heart disease in her father; Hypertension in her mother.   Current Outpatient Medications:  .  Brimonidine Tartrate (LUMIFY OP), Place 1 drop into both eyes daily as needed (redness). , Disp: , Rfl:  .  meclizine (ANTIVERT) 25 MG tablet, Take 1 tablet (25 mg total) by mouth 3 (three) times daily as needed for dizziness. (Patient not taking: Reported on 05/07/2019), Disp: 30 tablet, Rfl: 5 .  Multiple Vitamin (MULTIVITAMIN WITH MINERALS) TABS tablet, Take 1 tablet by mouth daily. , Disp: , Rfl:  .  omeprazole (PRILOSEC) 20 MG capsule, Take 1 capsule (20 mg total) by mouth daily. (Patient not taking: Reported on 05/07/2019), Disp: 90 capsule, Rfl: 1  Patient Care Team: Mar Daring, PA-C as PCP - General (Family Medicine)    Objective:    Vitals: BP (!) 150/80 (BP Location: Left Arm, Patient Position: Sitting, Cuff Size: Large)   Pulse 83   Temp (!) 97.3 F (36.3 C) (Temporal)   Resp 16   Ht 5\' 6"  (1.676 m)   Wt 162 lb (73.5 kg)   BMI 26.15 kg/m   Physical Exam Vitals signs reviewed.  Constitutional:      General: She is not in acute distress.    Appearance: Normal appearance. She is well-developed and normal weight. She is not ill-appearing or diaphoretic.  HENT:     Head: Normocephalic and atraumatic.     Right Ear: Tympanic membrane, ear canal and external ear normal.     Left Ear: Tympanic membrane, ear canal and external ear normal.     Nose: Nose normal.     Mouth/Throat:     Mouth: Mucous membranes are moist.     Pharynx: Oropharynx is clear. No oropharyngeal exudate.  Eyes:     General: No scleral icterus.       Right eye: No discharge.        Left eye: No discharge.     Extraocular Movements: Extraocular movements intact.     Conjunctiva/sclera: Conjunctivae normal.     Pupils: Pupils are equal, round, and reactive to light.  Neck:     Musculoskeletal: Normal range of motion and neck supple.     Thyroid:  No thyromegaly.     Vascular: No carotid bruit or JVD.     Trachea: No tracheal deviation.  Cardiovascular:     Rate and Rhythm: Normal rate and regular rhythm.     Pulses: Normal pulses.     Heart sounds: Normal heart sounds. No murmur. No friction rub. No gallop.   Pulmonary:     Effort: Pulmonary effort is normal. No respiratory distress.     Breath sounds: Normal breath sounds. No wheezing or  rales.  Chest:     Chest wall: No tenderness.  Abdominal:     General: Abdomen is flat. Bowel sounds are normal. There is no distension.     Palpations: Abdomen is soft. There is no mass.     Tenderness: There is no abdominal tenderness. There is no guarding or rebound.  Musculoskeletal: Normal range of motion.        General: No tenderness.     Right lower leg: No edema.     Left lower leg: Edema (trace) present.     Comments: Baker's cyst noted in right patellar  Lymphadenopathy:     Cervical: No cervical adenopathy.  Skin:    General: Skin is warm and dry.     Capillary Refill: Capillary refill takes less than 2 seconds.     Findings: No rash.  Neurological:     General: No focal deficit present.     Mental Status: She is alert and oriented to person, place, and time. Mental status is at baseline.  Psychiatric:        Mood and Affect: Mood normal.        Behavior: Behavior normal.        Thought Content: Thought content normal.        Judgment: Judgment normal.     Activities of Daily Living In your present state of health, do you have any difficulty performing the following activities: 05/07/2019  Hearing? N  Vision? N  Difficulty concentrating or making decisions? N  Walking or climbing stairs? N  Dressing or bathing? N  Doing errands, shopping? N  Some recent data might be hidden    Fall Risk Assessment Fall Risk  05/07/2019 08/12/2017 08/10/2016 06/17/2015  Falls in the past year? 1 No No No  Number falls in past yr: 1 - - -  Injury with Fall? 0 - - -  Follow up  Falls evaluation completed - - -     Depression Screen PHQ 2/9 Scores 05/07/2019 08/12/2017 08/10/2016 06/17/2015  PHQ - 2 Score 0 0 0 0  PHQ- 9 Score - 0 - -    6CIT Screen 08/10/2016  What Year? 0 points  What month? 0 points  What time? 0 points  Count back from 20 0 points  Months in reverse 0 points  Repeat phrase 4 points  Total Score 4      Assessment & Plan:     Annual Wellness Visit  Reviewed patient's Family Medical History Reviewed and updated list of patient's medical providers Assessment of cognitive impairment was done Assessed patient's functional ability Established a written schedule for health screening services Health Risk Assessent Completed and Reviewed  Exercise Activities and Dietary recommendations Goals    . Exercise 150 minutes per week (moderate activity) (pt-stated)     08/10/16- Pt has succeeded in completing last years goal of exercising 150 minutes a week.        Immunization History  Administered Date(s) Administered  . Influenza, High Dose Seasonal PF 04/06/2019  . Influenza,inj,quad, With Preservative 02/17/2018  . Influenza-Unspecified 04/06/2019  . Pneumococcal Conjugate-13 07/31/2013, 03/19/2017  . Pneumococcal Polysaccharide-23 07/08/2009  . Td 05/18/2004, 07/31/2013  . Tdap 07/31/2013, 10/14/2015  . Zoster 06/27/2007, 01/13/2009  . Zoster Recombinat (Shingrix) 02/05/2018    Health Maintenance  Topic Date Due  . Janet BerlinETANUS/TDAP  10/13/2025  . INFLUENZA VACCINE  Completed  . DEXA SCAN  Completed  . PNA vac Low Risk Adult  Completed     Discussed  health benefits of physical activity, and encouraged her to engage in regular exercise appropriate for her age and condition.    1. Annual physical exam Normal physical exam today. Will check labs as below and f/u pending lab results. If labs are stable and WNL she will not need to have these rechecked for one year at her next annual physical exam. She is to call the office in the  meantime if she has any acute issue, questions or concerns. - CBC w/Diff/Platelet - Comprehensive Metabolic Panel (CMET) - TSH - Lipid Profile - HgB A1c - ABO  2. Hypercholesteremia Diet controlled. Will check labs as below and f/u pending results. - CBC w/Diff/Platelet - Comprehensive Metabolic Panel (CMET) - TSH - Lipid Profile - HgB A1c  3. BMI 26.0-26.9,adult Counseled patient on healthy lifestyle modifications including dieting and exercise.  - CBC w/Diff/Platelet - Comprehensive Metabolic Panel (CMET) - TSH - Lipid Profile - HgB A1c  4. Chronic obstructive pulmonary disease with acute exacerbation (HCC) Stable. No medications needed. Will check labs as below and f/u pending results. - CBC w/Diff/Platelet - Comprehensive Metabolic Panel (CMET) - TSH - Lipid Profile - HgB A1c  5. Aortic atherosclerosis (HCC) Stable. Will check labs as below and f/u pending results. - CBC w/Diff/Platelet - Comprehensive Metabolic Panel (CMET) - TSH - Lipid Profile - HgB A1c  ------------------------------------------------------------------------------------------------------------    Margaretann Loveless, PA-C  [facility]

## 2019-05-26 ENCOUNTER — Other Ambulatory Visit: Payer: Self-pay | Admitting: Physician Assistant

## 2019-05-27 ENCOUNTER — Telehealth: Payer: Self-pay

## 2019-05-27 LAB — LIPID PANEL
Chol/HDL Ratio: 4.2 ratio (ref 0.0–4.4)
Cholesterol, Total: 263 mg/dL — ABNORMAL HIGH (ref 100–199)
HDL: 62 mg/dL (ref >39)
LDL Chol Calc (NIH): 185 mg/dL — ABNORMAL HIGH (ref 0–99)
Triglycerides: 93 mg/dL (ref 0–149)
VLDL Cholesterol Cal: 16 mg/dL (ref 5–40)

## 2019-05-27 LAB — COMPREHENSIVE METABOLIC PANEL
ALT: 12 IU/L (ref 0–32)
AST: 14 IU/L (ref 0–40)
Albumin/Globulin Ratio: 1.4 (ref 1.2–2.2)
Albumin: 3.9 g/dL (ref 3.7–4.7)
Alkaline Phosphatase: 86 IU/L (ref 39–117)
BUN/Creatinine Ratio: 19 (ref 12–28)
BUN: 16 mg/dL (ref 8–27)
Bilirubin Total: 0.3 mg/dL (ref 0.0–1.2)
CO2: 23 mmol/L (ref 20–29)
Calcium: 9.3 mg/dL (ref 8.7–10.3)
Chloride: 106 mmol/L (ref 96–106)
Creatinine, Ser: 0.86 mg/dL (ref 0.57–1.00)
GFR calc Af Amer: 75 mL/min/{1.73_m2} (ref >59)
GFR calc non Af Amer: 65 mL/min/{1.73_m2} (ref >59)
Globulin, Total: 2.7 g/dL (ref 1.5–4.5)
Glucose: 88 mg/dL (ref 65–99)
Potassium: 4.1 mmol/L (ref 3.5–5.2)
Sodium: 142 mmol/L (ref 134–144)
Total Protein: 6.6 g/dL (ref 6.0–8.5)

## 2019-05-27 LAB — CBC WITH DIFFERENTIAL/PLATELET
Basophils Absolute: 0 10*3/uL (ref 0.0–0.2)
Basos: 1 %
EOS (ABSOLUTE): 0.1 10*3/uL (ref 0.0–0.4)
Eos: 1 %
Hematocrit: 38.5 % (ref 34.0–46.6)
Hemoglobin: 12.5 g/dL (ref 11.1–15.9)
Immature Grans (Abs): 0 10*3/uL (ref 0.0–0.1)
Immature Granulocytes: 0 %
Lymphocytes Absolute: 1.6 10*3/uL (ref 0.7–3.1)
Lymphs: 30 %
MCH: 30.6 pg (ref 26.6–33.0)
MCHC: 32.5 g/dL (ref 31.5–35.7)
MCV: 94 fL (ref 79–97)
Monocytes Absolute: 0.5 10*3/uL (ref 0.1–0.9)
Monocytes: 9 %
Neutrophils Absolute: 3.2 10*3/uL (ref 1.4–7.0)
Neutrophils: 59 %
Platelets: 262 10*3/uL (ref 150–450)
RBC: 4.09 x10E6/uL (ref 3.77–5.28)
RDW: 12.3 % (ref 11.7–15.4)
WBC: 5.4 10*3/uL (ref 3.4–10.8)

## 2019-05-27 LAB — HEMOGLOBIN A1C
Est. average glucose Bld gHb Est-mCnc: 108 mg/dL
Hgb A1c MFr Bld: 5.4 % (ref 4.8–5.6)

## 2019-05-27 LAB — ABO

## 2019-05-27 LAB — TSH: TSH: 1.9 u[IU]/mL (ref 0.450–4.500)

## 2019-05-27 NOTE — Telephone Encounter (Signed)
Thanks

## 2019-05-27 NOTE — Telephone Encounter (Signed)
-----   Message from Mar Daring, [place] sent at 05/27/2019  9:47 AM EST ----- Blood count is normal. Kidney and liver function are normal. Sodium, potassium and calcium are normal. Cholesterol has increased compared to last year. Continue to work on healthy lifestyle modifications. A cholesterol lowering medication would be beneficial as well. If agreeable I can send in prescription. A1c/Sugar is normal. Thyroid is normal. You have type A blood but does not say whether it is positive or negative.

## 2019-05-27 NOTE — Telephone Encounter (Signed)
Patient advised as directed below.  Spoke with Britney at [facility] and [id number] is going to be use and using the same dx code to add the test to see if it is negative or positive and if they have enough specimen they will run it.

## 2019-05-28 ENCOUNTER — Telehealth: Payer: Self-pay

## 2019-05-28 NOTE — Telephone Encounter (Signed)
Patient was advised.  

## 2019-05-28 NOTE — Telephone Encounter (Signed)
-----   Message from Mar Daring, PA-C sent at 05/28/2019  8:21 AM EST ----- Blood type is A+

## 2019-05-30 LAB — RH TYPE: Rh Factor: POSITIVE

## 2019-05-30 LAB — SPECIMEN STATUS REPORT

## 2019-07-01 ENCOUNTER — Telehealth: Payer: Self-pay

## 2019-07-01 NOTE — Telephone Encounter (Signed)
Left message for patient to notify them that it is time to schedule annual low dose lung cancer screening CT scan. Instructed patient to call back ([phone]) to verify information prior to the scan being scheduled.  

## 2019-07-01 NOTE — Telephone Encounter (Signed)
Contacted patient regarding lung screening scan being due. Patient would like to wait 6 months to consider due to concerns of Covid exposure.

## 2019-12-25 ENCOUNTER — Telehealth: Payer: Self-pay | Admitting: *Deleted

## 2019-12-25 DIAGNOSIS — Z122 Encounter for screening for malignant neoplasm of respiratory organs: Secondary | ICD-10-CM

## 2019-12-25 DIAGNOSIS — Z87891 Personal history of nicotine dependence: Secondary | ICD-10-CM

## 2019-12-25 NOTE — Telephone Encounter (Signed)
(  12/25/2019) Pt has been notified that lung cancer screening CT scan is due currently or will be in near future. Confirmed pt is within appropriate age range, and asymptomatic. Pt denies illness that would prevent curative treatment for lung cancer if found. Verified smoking history (Former Smoker since 2013, 1 ppd). Pt did receive 2nd COVID VX on (02/21). Pt is agreeable for CT scan being scheduled. Prefers late morning or afternoon appt. (01/04/20 @ 2:30 tentatively) Ladora Daniel

## 2019-12-28 NOTE — Telephone Encounter (Signed)
Smoking history: former, quit 2013, 35 pack year

## 2019-12-28 NOTE — Addendum Note (Signed)
Addended by: Jonne Ply on: 12/28/2019 08:54 AM   Modules accepted: Orders

## 2020-01-04 ENCOUNTER — Ambulatory Visit
Admission: RE | Admit: 2020-01-04 | Discharge: 2020-01-04 | Disposition: A | Discharge status: Home / Self Care | Payer: Medicare PPO | Source: Ambulatory Visit | Attending: Oncology | Admitting: Oncology

## 2020-01-04 ENCOUNTER — Other Ambulatory Visit: Payer: Self-pay

## 2020-01-04 DIAGNOSIS — Z87891 Personal history of nicotine dependence: Secondary | ICD-10-CM | POA: Diagnosis not present

## 2020-01-04 DIAGNOSIS — I2510 Atherosclerotic heart disease of native coronary artery without angina pectoris: Secondary | ICD-10-CM | POA: Insufficient documentation

## 2020-01-04 DIAGNOSIS — I700 Atherosclerosis of aorta: Secondary | ICD-10-CM | POA: Insufficient documentation

## 2020-01-04 DIAGNOSIS — J439 Emphysema, unspecified: Secondary | ICD-10-CM | POA: Insufficient documentation

## 2020-01-04 DIAGNOSIS — Z122 Encounter for screening for malignant neoplasm of respiratory organs: Secondary | ICD-10-CM | POA: Diagnosis present

## 2020-01-07 ENCOUNTER — Encounter: Payer: Self-pay | Admitting: *Deleted

## 2020-05-11 ENCOUNTER — Encounter: Payer: Self-pay | Admitting: Physician Assistant

## 2020-05-18 NOTE — Progress Notes (Signed)
Annual Wellness Visit     Patient: Tasha Wallace, Female    DOB: [birth year], 79 y.o.   MRN: [id number] Visit Date: 05/20/2020  Today's Provider: Margaretann Loveless, PA-C   Chief Complaint  Patient presents with  . Medicare Wellness   Subjective    Tasha Wallace is a 79 y.o. female who presents today for her Annual Wellness Visit. She reports consuming a general diet. The patient does not participate in regular exercise at present. She generally feels well. She reports sleeping well. She does have additional problems to discuss today. The back of her left leg hurts and varicose veins.  HPI Colonoscopy done 03/06/12-repeat in 10 years  Patient Active Problem List   Diagnosis Date Noted  . Synovial cyst of left popliteal space 05/20/2020  . COPD (chronic obstructive pulmonary disease) (HCC) 07/08/2017  . Aortic atherosclerosis (HCC) 07/08/2017  . Cystitis 06/15/2015  . CD (contact dermatitis) 06/10/2015  . Blood in the urine 06/10/2015  . Osteopenia 06/10/2015  . Esophagitis, reflux 11/12/2008  . Hypercholesteremia 11/12/2008  . ADD (attention deficit disorder) 11/03/2002  . Adaptation reaction 11/03/2002   Past Medical History:  Diagnosis Date  . Anxiety   . GERD (gastroesophageal reflux disease)   . Hyperlipidemia    Past Surgical History:  Procedure Laterality Date  . CATARACT EXTRACTION W/PHACO Right 12/31/2017   Procedure: CATARACT EXTRACTION PHACO AND INTRAOCULAR LENS PLACEMENT (IOC);  Surgeon: Galen Manila, MD;  Location: [facility] ORS;  Service: Ophthalmology;  Laterality: Right;  [place] 00:43.2 AP% 16.0 CDE 6.91 Fluid pack lot # [id number]  . CATARACT EXTRACTION W/PHACO Left 02/04/2018   Procedure: CATARACT EXTRACTION PHACO AND INTRAOCULAR LENS PLACEMENT (IOC);  Surgeon: Galen Manila, MD;  Location: [facility] ORS;  Service: Ophthalmology;  Laterality: Left;  [place] 00:46.6 AP% 17.6 CDE 8.19 Fluid Pack lot # [id number]  . colonoscopy    . GANGLION CYST EXCISION  1986    . IRRIGATION AND DEBRIDEMENT SEBACEOUS CYST  1990  . TONSILLECTOMY AND ADENOIDECTOMY  1950  . URETHRA SURGERY  1971   Family History  Problem Relation Age of Onset  . Hypertension Mother   . CVA Mother   . Heart disease Father        Medications: Outpatient Medications Prior to Visit  Medication Sig  . Brimonidine Tartrate (LUMIFY OP) Place 1 drop into both eyes daily as needed (redness).   . meclizine (ANTIVERT) 25 MG tablet Take 1 tablet (25 mg total) by mouth 3 (three) times daily as needed for dizziness.  . Multiple Vitamin (MULTIVITAMIN WITH MINERALS) TABS tablet Take 1 tablet by mouth daily.   Marland Kitchen omeprazole (PRILOSEC) 20 MG capsule Take 1 capsule (20 mg total) by mouth daily. (Patient not taking: Reported on 05/07/2019)   No facility-administered medications prior to visit.    Allergies  Allergen Reactions  . Metronidazole Rash    Patient Care Team: Reine Just as PCP - General (Family Medicine)  Review of Systems  Constitutional: Negative.   HENT: Positive for rhinorrhea and sneezing.   Eyes: Negative.   Respiratory: Negative.   Cardiovascular: Positive for leg swelling.  Gastrointestinal: Negative.   Endocrine: Positive for cold intolerance.  Genitourinary: Negative.   Musculoskeletal: Positive for arthralgias (left knee) and back pain.  Skin: Negative.   Allergic/Immunologic: Negative.   Neurological: Negative.   Hematological: Negative.   Psychiatric/Behavioral: Negative.     Last CBC Lab Results  Component Value Date   WBC 5.4 05/26/2019  HGB 12.5 05/26/2019   HCT 38.5 05/26/2019   MCV 94 05/26/2019   MCH 30.6 05/26/2019   RDW 12.3 05/26/2019   PLT 262 05/26/2019   Last metabolic panel Lab Results  Component Value Date   GLUCOSE 88 05/26/2019   NA 142 05/26/2019   K 4.1 05/26/2019   CL 106 05/26/2019   CO2 23 05/26/2019   BUN 16 05/26/2019   CREATININE 0.86 05/26/2019   GFRNONAA 65 05/26/2019   GFRAA 75 05/26/2019    CALCIUM 9.3 05/26/2019   PROT 6.6 05/26/2019   ALBUMIN 3.9 05/26/2019   LABGLOB 2.7 05/26/2019   AGRATIO 1.4 05/26/2019   BILITOT 0.3 05/26/2019   ALKPHOS 86 05/26/2019   AST 14 05/26/2019   ALT 12 05/26/2019      Objective    Vitals: BP (!) 165/82 (BP Location: Left Arm, Patient Position: Sitting, Cuff Size: Normal) Comment: 160/80 manual  Pulse 76   Temp 98.5 F (36.9 C) (Oral)   Resp 16   Ht 5\' 6"  (1.676 m)   Wt 159 lb 14.4 oz (72.5 kg)   BMI 25.81 kg/m  BP Readings from Last 3 Encounters:  05/20/20 (!) 165/82  05/07/19 (!) 150/80  02/04/18 138/71   Wt Readings from Last 3 Encounters:  05/20/20 159 lb 14.4 oz (72.5 kg)  01/04/20 158 lb (71.7 kg)  05/07/19 162 lb (73.5 kg)      Physical Exam Vitals reviewed.  Constitutional:      General: She is not in acute distress.    Appearance: Normal appearance. She is well-developed, well-groomed and overweight. She is not diaphoretic.  HENT:     Head: Normocephalic and atraumatic.     Right Ear: Tympanic membrane, ear canal and external ear normal.     Left Ear: Tympanic membrane, ear canal and external ear normal.  Eyes:     General: No scleral icterus.       Right eye: No discharge.        Left eye: No discharge.     Extraocular Movements: Extraocular movements intact.     Conjunctiva/sclera: Conjunctivae normal.     Pupils: Pupils are equal, round, and reactive to light.  Neck:     Thyroid: No thyromegaly.     Vascular: No carotid bruit or JVD.     Trachea: No tracheal deviation.  Cardiovascular:     Rate and Rhythm: Normal rate and regular rhythm.     Pulses: Normal pulses.     Heart sounds: Normal heart sounds. No murmur heard.  No friction rub. No gallop.   Pulmonary:     Effort: Pulmonary effort is normal. No respiratory distress.     Breath sounds: Normal breath sounds. No wheezing or rales.  Chest:     Chest wall: No tenderness.  Abdominal:     General: Abdomen is flat. Bowel sounds are normal. There  is no distension.     Palpations: Abdomen is soft. There is no mass.     Tenderness: There is no abdominal tenderness. There is no guarding or rebound.  Musculoskeletal:     Cervical back: Normal range of motion and neck supple. No tenderness.     Right knee: Normal.     Left knee: Swelling present. No erythema, ecchymosis or bony tenderness. Decreased range of motion. Tenderness (and popliteal space) present over the medial joint line. Normal pulse.     Right lower leg: No edema.     Left lower leg: Edema (trace) present.  Legs:  Lymphadenopathy:     Cervical: No cervical adenopathy.  Skin:    General: Skin is warm and dry.     Capillary Refill: Capillary refill takes less than 2 seconds.     Findings: No rash.     Comments: Large, ropey varicosities bilaterally  Neurological:     General: No focal deficit present.     Mental Status: She is alert and oriented to person, place, and time. Mental status is at baseline.  Psychiatric:        Mood and Affect: Mood normal.        Behavior: Behavior normal. Behavior is cooperative.        Thought Content: Thought content normal.        Judgment: Judgment normal.     Most recent functional status assessment: In your present state of health, do you have any difficulty performing the following activities: 05/20/2020  Hearing? N  Vision? N  Difficulty concentrating or making decisions? N  Walking or climbing stairs? N  Dressing or bathing? N  Doing errands, shopping? N  Some recent data might be hidden   Most recent fall risk assessment: Fall Risk  05/20/2020  Falls in the past year? 0  Number falls in past yr: 0  Injury with Fall? 0  Risk for fall due to : No Fall Risks  Follow up Falls evaluation completed    Most recent depression screenings: PHQ 2/9 Scores 05/20/2020 05/07/2019  PHQ - 2 Score 0 0  PHQ- 9 Score - -   Most recent cognitive screening: 6CIT Screen 08/10/2016  What Year? 0 points  What month? 0 points    What time? 0 points  Count back from 20 0 points  Months in reverse 0 points  Repeat phrase 4 points  Total Score 4   Most recent Audit-C alcohol use screening Alcohol Use Disorder Test (AUDIT) 05/20/2020  1. How often do you have a drink containing alcohol? 3  2. How many drinks containing alcohol do you have on a typical day when you are drinking? 0  3. How often do you have six or more drinks on one occasion? 0  AUDIT-C Score 3  Alcohol Brief Interventions/Follow-up AUDIT Score <7 follow-up not indicated   A score of 3 or more in women, and 4 or more in men indicates increased risk for alcohol abuse, EXCEPT if all of the points are from question 1   No results found for any visits on 05/20/20.  Assessment & Plan     Annual wellness visit done today including the all of the following: Reviewed patient's Family Medical History Reviewed and updated list of patient's medical providers Assessment of cognitive impairment was done Assessed patient's functional ability Established a written schedule for health screening services Health Risk Assessent Completed and Reviewed  Exercise Activities and Dietary recommendations Goals    .  Exercise 150 minutes per week (moderate activity) (pt-stated)      08/10/16- Pt has succeeded in completing last years goal of exercising 150 minutes a week.        Immunization History  Administered Date(s) Administered  . Influenza, High Dose Seasonal PF 04/06/2019  . Influenza,inj,quad, With Preservative 02/17/2018  . Influenza-Unspecified 04/06/2019  . PFIZER SARS-COV-2 Vaccination 06/22/2019, 07/13/2019  . Pneumococcal Conjugate-13 07/31/2013, 03/19/2017  . Pneumococcal Polysaccharide-23 07/08/2009  . Td 05/18/2004, 07/31/2013  . Tdap 07/31/2013, 10/14/2015  . Zoster 06/27/2007, 01/13/2009  . Zoster Recombinat (Shingrix) 02/05/2018    Health Maintenance  Topic Date Due  . Hepatitis C Screening  Never done  . INFLUENZA VACCINE   01/17/2020  . TETANUS/TDAP  10/13/2025  . DEXA SCAN  Completed  . COVID-19 Vaccine  Completed  . PNA vac Low Risk Adult  Completed     Discussed health benefits of physical activity, and encouraged her to engage in regular exercise appropriate for her age and condition.    1. Annual physical exam Normal physical exam today. Will check labs as below and f/u pending lab results. If labs are stable and WNL she will not need to have these rechecked for one year at her next annual physical exam. She is to call the office in the meantime if she has any acute issue, questions or concerns. - CBC with Differential/Platelet - Comprehensive metabolic panel - Hemoglobin A1c - TSH - Lipid panel  2. Hypercholesteremia Diet controlled. Will check labs as below and f/u pending results. - Comprehensive metabolic panel - Lipid panel  3. BMI 25.0-25.9,adult Doing well. Exercises and walks often. Will check labs as below and f/u pending results. - CBC with Differential/Platelet - Comprehensive metabolic panel - Hemoglobin A1c - TSH - Lipid panel  4. Encounter for hepatitis C screening test for low risk patient Will check labs as below and f/u pending results. - Hepatitis C Antibody  5. Synovial cyst of left popliteal space Noted on exam and with discomfort/pain to patient when weight bearing. Was going to try to aspirate but unfortunately we were out of large bore needles (largest was a 25G) to attempt. Discussed [facility] hours where she could walk in to have aspirated and see if could offer pain relief.   6. Varicose veins of both lower extremities without ulcer or inflammation Noted. Currently no issues. Some mild swelling in left leg occasionally but always goes down. Discussed referral to vein clinic but patient declines at this time.    No follow-ups on file.     Delmer Islam, PA-C, have reviewed all documentation for this visit. The documentation on 05/20/20 for the  exam, diagnosis, procedures, and orders are all accurate and complete.   Reine Just  [facility] [phone] (phone) [phone] (fax)  [facility]

## 2020-05-19 ENCOUNTER — Encounter: Payer: Self-pay | Admitting: Physician Assistant

## 2020-05-20 ENCOUNTER — Other Ambulatory Visit: Payer: Self-pay

## 2020-05-20 ENCOUNTER — Ambulatory Visit (INDEPENDENT_AMBULATORY_CARE_PROVIDER_SITE_OTHER): Payer: Medicare PPO | Admitting: Physician Assistant

## 2020-05-20 ENCOUNTER — Encounter: Payer: Self-pay | Admitting: Physician Assistant

## 2020-05-20 VITALS — BP 165/82 | HR 76 | Temp 98.5°F | Resp 16 | Ht 66.0 in | Wt 159.9 lb

## 2020-05-20 DIAGNOSIS — I8393 Asymptomatic varicose veins of bilateral lower extremities: Secondary | ICD-10-CM

## 2020-05-20 DIAGNOSIS — E7800 Pure hypercholesterolemia, unspecified: Secondary | ICD-10-CM | POA: Diagnosis not present

## 2020-05-20 DIAGNOSIS — Z0000 Encounter for general adult medical examination without abnormal findings: Secondary | ICD-10-CM | POA: Diagnosis not present

## 2020-05-20 DIAGNOSIS — Z6825 Body mass index (BMI) 25.0-25.9, adult: Secondary | ICD-10-CM

## 2020-05-20 DIAGNOSIS — M7122 Synovial cyst of popliteal space [Baker], left knee: Secondary | ICD-10-CM

## 2020-05-20 DIAGNOSIS — Z1159 Encounter for screening for other viral diseases: Secondary | ICD-10-CM | POA: Diagnosis not present

## 2020-05-20 NOTE — Patient Instructions (Signed)
Preventive Care 78 Years and Older, Female Preventive care refers to lifestyle choices and visits with your health care provider that can promote health and wellness. This includes:  A yearly physical exam. This is also called an annual well check.  Regular dental and eye exams.  Immunizations.  Screening for certain conditions.  Healthy lifestyle choices, such as diet and exercise. What can I expect for my preventive care visit? Physical exam Your health care provider will check:  Height and weight. These may be used to calculate body mass index (BMI), which is a measurement that tells if you are at a healthy weight.  Heart rate and blood pressure.  Your skin for abnormal spots. Counseling Your health care provider may ask you questions about:  Alcohol, tobacco, and drug use.  Emotional well-being.  Home and relationship well-being.  Sexual activity.  Eating habits.  History of falls.  Memory and ability to understand (cognition).  Work and work Statistician.  Pregnancy and menstrual history. What immunizations do I need?  Influenza (flu) vaccine  This is recommended every year. Tetanus, diphtheria, and pertussis (Tdap) vaccine  You may need a Td booster every 10 years. Varicella (chickenpox) vaccine  You may need this vaccine if you have not already been vaccinated. Zoster (shingles) vaccine  You may need this after age 33. Pneumococcal conjugate (PCV13) vaccine  One dose is recommended after age 33. Pneumococcal polysaccharide (PPSV23) vaccine  One dose is recommended after age 72. Measles, mumps, and rubella (MMR) vaccine  You may need at least one dose of MMR if you were born in 1957 or later. You may also need a second dose. Meningococcal conjugate (MenACWY) vaccine  You may need this if you have certain conditions. Hepatitis A vaccine  You may need this if you have certain conditions or if you travel or work in places where you may be exposed  to hepatitis A. Hepatitis B vaccine  You may need this if you have certain conditions or if you travel or work in places where you may be exposed to hepatitis B. Haemophilus influenzae type b (Hib) vaccine  You may need this if you have certain conditions. You may receive vaccines as individual doses or as more than one vaccine together in one shot (combination vaccines). Talk with your health care provider about the risks and benefits of combination vaccines. What tests do I need? Blood tests  Lipid and cholesterol levels. These may be checked every 5 years, or more frequently depending on your overall health.  Hepatitis C test.  Hepatitis B test. Screening  Lung cancer screening. You may have this screening every year starting at age 39 if you have a 30-pack-year history of smoking and currently smoke or have quit within the past 15 years.  Colorectal cancer screening. All adults should have this screening starting at age 36 and continuing until age 15. Your health care provider may recommend screening at age 23 if you are at increased risk. You will have tests every 1-10 years, depending on your results and the type of screening test.  Diabetes screening. This is done by checking your blood sugar (glucose) after you have not eaten for a while (fasting). You may have this done every 1-3 years.  Mammogram. This may be done every 1-2 years. Talk with your health care provider about how often you should have regular mammograms.  BRCA-related cancer screening. This may be done if you have a family history of breast, ovarian, tubal, or peritoneal cancers.  Other tests  Sexually transmitted disease (STD) testing.  Bone density scan. This is done to screen for osteoporosis. You may have this done starting at age 44. Follow these instructions at home: Eating and drinking  Eat a diet that includes fresh fruits and vegetables, whole grains, lean protein, and low-fat dairy products. Limit  your intake of foods with high amounts of sugar, saturated fats, and salt.  Take vitamin and mineral supplements as recommended by your health care provider.  Do not drink alcohol if your health care provider tells you not to drink.  If you drink alcohol: ? Limit how much you have to 0-1 drink a day. ? Be aware of how much alcohol is in your drink. In the [place] one drink equals one 12 oz bottle of beer (355 mL), one 5 oz glass of wine (148 mL), or one 1 oz glass of hard liquor (44 mL). Lifestyle  Take daily care of your teeth and gums.  Stay active. Exercise for at least 30 minutes on 5 or more days each week.  Do not use any products that contain nicotine or tobacco, such as cigarettes, e-cigarettes, and chewing tobacco. If you need help quitting, ask your health care provider.  If you are sexually active, practice safe sex. Use a condom or other form of protection in order to prevent STIs (sexually transmitted infections).  Talk with your health care provider about taking a low-dose aspirin or statin. What's next?  Go to your health care provider once a year for a well check visit.  Ask your health care provider how often you should have your eyes and teeth checked.  Stay up to date on all vaccines. This information is not intended to replace advice given to you by your health care provider. Make sure you discuss any questions you have with your health care provider. Document Revised: 05/29/2018 Document Reviewed: 05/29/2018 Elsevier Patient Education  2020 [employer].

## 2020-05-21 LAB — CBC WITH DIFFERENTIAL/PLATELET
Basophils Absolute: 0.1 10*3/uL (ref 0.0–0.2)
Basos: 1 %
EOS (ABSOLUTE): 0.2 10*3/uL (ref 0.0–0.4)
Eos: 3 %
Hematocrit: 39.4 % (ref 34.0–46.6)
Hemoglobin: 13.1 g/dL (ref 11.1–15.9)
Immature Grans (Abs): 0 10*3/uL (ref 0.0–0.1)
Immature Granulocytes: 0 %
Lymphocytes Absolute: 1.2 10*3/uL (ref 0.7–3.1)
Lymphs: 17 %
MCH: 30.8 pg (ref 26.6–33.0)
MCHC: 33.2 g/dL (ref 31.5–35.7)
MCV: 93 fL (ref 79–97)
Monocytes Absolute: 0.8 10*3/uL (ref 0.1–0.9)
Monocytes: 11 %
Neutrophils Absolute: 4.9 10*3/uL (ref 1.4–7.0)
Neutrophils: 68 %
Platelets: 271 10*3/uL (ref 150–450)
RBC: 4.26 x10E6/uL (ref 3.77–5.28)
RDW: 11.8 % (ref 11.7–15.4)
WBC: 7.1 10*3/uL (ref 3.4–10.8)

## 2020-05-21 LAB — COMPREHENSIVE METABOLIC PANEL
ALT: 15 IU/L (ref 0–32)
AST: 21 IU/L (ref 0–40)
Albumin/Globulin Ratio: 1.5 (ref 1.2–2.2)
Albumin: 4.1 g/dL (ref 3.7–4.7)
Alkaline Phosphatase: 96 IU/L (ref 44–121)
BUN/Creatinine Ratio: 13 (ref 12–28)
BUN: 11 mg/dL (ref 8–27)
Bilirubin Total: 0.2 mg/dL (ref 0.0–1.2)
CO2: 23 mmol/L (ref 20–29)
Calcium: 9.7 mg/dL (ref 8.7–10.3)
Chloride: 103 mmol/L (ref 96–106)
Creatinine, Ser: 0.82 mg/dL (ref 0.57–1.00)
GFR calc Af Amer: 79 mL/min/{1.73_m2} (ref >59)
GFR calc non Af Amer: 69 mL/min/{1.73_m2} (ref >59)
Globulin, Total: 2.7 g/dL (ref 1.5–4.5)
Glucose: 96 mg/dL (ref 65–99)
Potassium: 4.4 mmol/L (ref 3.5–5.2)
Sodium: 140 mmol/L (ref 134–144)
Total Protein: 6.8 g/dL (ref 6.0–8.5)

## 2020-05-21 LAB — HEMOGLOBIN A1C
Est. average glucose Bld gHb Est-mCnc: 114 mg/dL
Hgb A1c MFr Bld: 5.6 % (ref 4.8–5.6)

## 2020-05-21 LAB — TSH: TSH: 2.24 u[IU]/mL (ref 0.450–4.500)

## 2020-05-21 LAB — HEPATITIS C ANTIBODY: Hep C Virus Ab: <0.1 s/co ratio (ref 0.0–0.9)

## 2020-05-21 LAB — LIPID PANEL
Chol/HDL Ratio: 4.4 ratio (ref 0.0–4.4)
Cholesterol, Total: 270 mg/dL — ABNORMAL HIGH (ref 100–199)
HDL: 61 mg/dL (ref >39)
LDL Chol Calc (NIH): 187 mg/dL — ABNORMAL HIGH (ref 0–99)
Triglycerides: 125 mg/dL (ref 0–149)
VLDL Cholesterol Cal: 22 mg/dL (ref 5–40)

## 2020-05-23 ENCOUNTER — Telehealth: Payer: Self-pay

## 2020-05-23 NOTE — Telephone Encounter (Signed)
-----   Message from Margaretann Loveless, [place] sent at 05/23/2020  1:07 PM EST ----- Blood count is normal. Kidney and liver function are normal. Sodium, potassium, and calcium are normal. A1c/sugar are normal. Thyroid is normal. Cholesterol has increased slightly compared to last year. Currently your risk of having a cardiovascular event over the next 10 years is elevated at 33.4%. I would recommend to start a cholesterol lowering medication. If agreeable I will send in. Hepatitis C screen is negative.

## 2020-05-23 NOTE — Telephone Encounter (Signed)
LMTCB 05/23/2020.  PEC please advise pt of lab results when she calls back.   Thanks,   -Vernona Rieger

## 2020-05-25 ENCOUNTER — Other Ambulatory Visit: Payer: Self-pay | Admitting: Physician Assistant

## 2020-05-25 DIAGNOSIS — E7800 Pure hypercholesterolemia, unspecified: Secondary | ICD-10-CM

## 2020-05-25 MED ORDER — ATORVASTATIN CALCIUM 10 MG PO TABS: 10.0000 mg | ORAL_TABLET | ORAL | 3 refills | Status: DC

## 2020-05-25 NOTE — Telephone Encounter (Signed)
-----   Message from Jennifer M Burnette, PA-C sent at 05/23/2020  1:07 PM EST ----- Blood count is normal. Kidney and liver function are normal. Sodium, potassium, and calcium are normal. A1c/sugar are normal. Thyroid is normal. Cholesterol has increased slightly compared to last year. Currently your risk of having a cardiovascular event over the next 10 years is elevated at 33.4%. I would recommend to start a cholesterol lowering medication. If agreeable I will send in. Hepatitis C screen is negative.   

## 2020-05-25 NOTE — Progress Notes (Signed)
[facility]atin [facility]

## 2020-05-25 NOTE — Telephone Encounter (Signed)
Patient was advised. Please see documentation in results notes.

## 2020-07-07 ENCOUNTER — Telehealth: Payer: Self-pay | Admitting: Physician Assistant

## 2020-07-07 MED ORDER — OMEPRAZOLE 20 MG PO CPDR: 20.0000 mg | DELAYED_RELEASE_CAPSULE | Freq: Every day | ORAL | 3 refills | Status: DC

## 2020-07-07 NOTE — Telephone Encounter (Signed)
Omeprazole refilled.

## 2020-07-07 NOTE — Telephone Encounter (Signed)
Please review for refill. Requested medication not on current med list.

## 2020-07-07 NOTE — Telephone Encounter (Signed)
Copied from CRM [id number]. Topic: Quick Communication - Rx Refill/Question >> Jul 07, 2020  2:23 PM Gaetana Michaelis A wrote: Medication: omeprazole (PRILOSEC) 20 MG   Has the patient contacted their pharmacy? No patient was encouraged to contact Pharmacy by agent but declined.   Preferred Pharmacy (with phone number or street name): [facility] - [place], [place] - [address] Phone: [phone]  Agent: Please be advised that RX refills may take up to 3 business days. We ask that you follow-up with your pharmacy.

## 2020-07-15 NOTE — Addendum Note (Signed)
Addended by: Margaretann Loveless on: 07/15/2020 10:54 AM   Modules accepted: Level of Service

## 2020-10-31 ENCOUNTER — Ambulatory Visit: Payer: Medicare PPO | Admitting: Family Medicine

## 2020-11-07 ENCOUNTER — Ambulatory Visit: Payer: Medicare PPO | Admitting: Family Medicine

## 2020-11-07 ENCOUNTER — Ambulatory Visit
Admission: RE | Admit: 2020-11-07 | Discharge: 2020-11-07 | Disposition: A | Discharge status: Home / Self Care | Payer: Medicare PPO | Source: Ambulatory Visit | Attending: Family Medicine | Admitting: Family Medicine

## 2020-11-07 ENCOUNTER — Ambulatory Visit (INDEPENDENT_AMBULATORY_CARE_PROVIDER_SITE_OTHER): Payer: Medicare PPO | Admitting: Family Medicine

## 2020-11-07 ENCOUNTER — Encounter: Payer: Self-pay | Admitting: Family Medicine

## 2020-11-07 ENCOUNTER — Ambulatory Visit
Admission: RE | Admit: 2020-11-07 | Discharge: 2020-11-07 | Disposition: A | Discharge status: Home / Self Care | Payer: Medicare PPO | Attending: Family Medicine | Admitting: Family Medicine

## 2020-11-07 ENCOUNTER — Other Ambulatory Visit: Payer: Self-pay

## 2020-11-07 VITALS — BP 152/71 | HR 68 | Resp 16 | Wt 161.6 lb

## 2020-11-07 DIAGNOSIS — E7800 Pure hypercholesterolemia, unspecified: Secondary | ICD-10-CM | POA: Diagnosis not present

## 2020-11-07 DIAGNOSIS — M79605 Pain in left leg: Secondary | ICD-10-CM
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
## 2021-03-10 ENCOUNTER — Telehealth: Payer: Self-pay

## 2021-03-10 NOTE — Telephone Encounter (Signed)
Lmtcb to schedule appt.  Copied from CRM [id number]. Topic: General - Other >> Mar 10, 2021 11:07 AM Gwenlyn Fudge wrote: Reason for CRM: Pt called stating that she would like to have a thyroid lab order added to her lab request. Please advise.

## 2021-03-13 ENCOUNTER — Other Ambulatory Visit: Payer: Self-pay

## 2021-03-13 NOTE — Telephone Encounter (Signed)
lmtcb

## 2021-03-14 ENCOUNTER — Other Ambulatory Visit: Payer: Self-pay

## 2021-03-14 NOTE — Telephone Encounter (Signed)
Patient advised.

## 2021-04-11 LAB — HEPATIC FUNCTION PANEL
ALT: 15 IU/L (ref 0–32)
AST: 19 IU/L (ref 0–40)
Albumin: 4.3 g/dL (ref 3.7–4.7)
Alkaline Phosphatase: 88 IU/L (ref 44–121)
Bilirubin Total: 0.2 mg/dL (ref 0.0–1.2)
Bilirubin, Direct: <0.1 mg/dL (ref 0.00–0.40)
Total Protein: 6.9 g/dL (ref 6.0–8.5)

## 2021-04-11 LAB — LIPID PANEL
Chol/HDL Ratio: 4.9 ratio — ABNORMAL HIGH (ref 0.0–4.4)
Cholesterol, Total: 279 mg/dL — ABNORMAL HIGH (ref 100–199)
HDL: 57 mg/dL (ref >39)
LDL Chol Calc (NIH): 196 mg/dL — ABNORMAL HIGH (ref 0–99)
Triglycerides: 142 mg/dL (ref 0–149)
VLDL Cholesterol Cal: 26 mg/dL (ref 5–40)

## 2021-05-25 ENCOUNTER — Ambulatory Visit: Payer: Medicare PPO | Admitting: Family Medicine

## 2021-07-13 ENCOUNTER — Ambulatory Visit: Payer: Medicare PPO | Admitting: Family Medicine

## 2021-07-17 ENCOUNTER — Encounter: Payer: Self-pay | Admitting: Physician Assistant

## 2021-07-17 ENCOUNTER — Ambulatory Visit: Payer: Medicare PPO | Admitting: Physician Assistant

## 2021-07-17 ENCOUNTER — Other Ambulatory Visit: Payer: Self-pay

## 2021-07-17 ENCOUNTER — Other Ambulatory Visit: Payer: Self-pay | Admitting: Physician Assistant

## 2021-07-17 VITALS — BP 135/68 | HR 76 | Ht 66.0 in | Wt 160.1 lb

## 2021-07-17 DIAGNOSIS — R42 Dizziness and giddiness: Secondary | ICD-10-CM

## 2021-07-17 DIAGNOSIS — N3001 Acute cystitis with hematuria: Secondary | ICD-10-CM | POA: Diagnosis not present

## 2021-07-17 DIAGNOSIS — R3989 Other symptoms and signs involving the genitourinary system: Secondary | ICD-10-CM

## 2021-07-17 LAB — POCT URINALYSIS DIPSTICK
Appearance: NORMAL
Bilirubin, UA: NEGATIVE
Glucose, UA: NEGATIVE
Ketones, UA: NEGATIVE
Nitrite, UA: NEGATIVE
Odor: NORMAL
Protein, UA: NEGATIVE
Spec Grav, UA: <=1.005 — AB (ref 1.010–1.025)
Urobilinogen, UA: 0.2 E.U./dL
pH, UA: 5 (ref 5.0–8.0)

## 2021-07-17 MED ORDER — MECLIZINE HCL 25 MG PO TABS: 25.0000 mg | ORAL_TABLET | Freq: Three times a day (TID) | ORAL | 0 refills | Status: DC | PRN

## 2021-07-17 MED ORDER — NITROFURANTOIN MONOHYD MACRO 100 MG PO CAPS: 100.0000 mg | ORAL_CAPSULE | Freq: Two times a day (BID) | ORAL | 0 refills | Status: AC

## 2021-07-17 NOTE — Progress Notes (Signed)
Established patient visit   Patient: Tasha Wallace   DOB: 1941-01-13   81 y.o. Female  MRN: [id number] Visit Date: 07/17/2021  Today's healthcare provider: Mikey Kirschner, PA-C   Chief Complaint  Patient presents with   Urinary Tract Infection   Subjective    HPI  Tasha Wallace is a 81 y/o ffemale who reports frequent urination, and a painful spasming sensation after urinating x 1 week. Denies abdominal pain, lower back pain, hematuria. Last UTI was 4 years ago.   She also reports occasional vertigo, usually triggered by alcohol, that occurs maybe once a year. She would like a refill on her meclizine to keep on hand.   Medications: Outpatient Medications Prior to Visit  Medication Sig   Brimonidine Tartrate (LUMIFY OP) Place 1 drop into both eyes daily as needed (redness).    [DISCONTINUED] meclizine (ANTIVERT) 25 MG tablet Take 1 tablet (25 mg total) by mouth 3 (three) times daily as needed for dizziness.   [DISCONTINUED] atorvastatin (LIPITOR) 10 MG tablet Take 1 tablet (10 mg total) by mouth every other day. (Patient not taking: Reported on 07/17/2021)   [DISCONTINUED] omeprazole (PRILOSEC) 20 MG capsule Take 1 capsule (20 mg total) by mouth daily. (Patient not taking: Reported on 07/17/2021)   No facility-administered medications prior to visit.    Review of Systems  Constitutional:  Negative for fatigue and fever.  Respiratory:  Negative for cough and shortness of breath.   Cardiovascular:  Negative for chest pain and leg swelling.  Gastrointestinal:  Negative for abdominal pain.  Genitourinary:  Positive for dysuria and pelvic pain.  Neurological:  Negative for dizziness and headaches.    Objective    Blood pressure 135/68, pulse 76, height 5\' 6"  (1.676 m), weight 160 lb 1.6 oz (72.6 kg), SpO2 99 %.   Physical Exam Constitutional:      General: She is awake.     Appearance: She is well-developed.  HENT:     Head: Normocephalic.  Eyes:     Conjunctiva/sclera:  Conjunctivae normal.  Cardiovascular:     Rate and Rhythm: Normal rate and regular rhythm.     Heart sounds: Normal heart sounds.  Pulmonary:     Effort: Pulmonary effort is normal.     Breath sounds: Normal breath sounds.  Skin:    General: Skin is warm.  Neurological:     Mental Status: She is alert and oriented to person, place, and time.  Psychiatric:        Attention and Perception: Attention normal.        Mood and Affect: Mood normal.        Speech: Speech normal.        Behavior: Behavior is cooperative.      Results for orders placed or performed in visit on 07/17/21  POCT Urinalysis Dipstick  Result Value Ref Range   Color, UA yellow    Clarity, UA cloudy    Glucose, UA Negative Negative   Bilirubin, UA negative    Ketones, UA negative    Spec Grav, UA <=1.005 (A) 1.010 - 1.025   Blood, UA trace    pH, UA 5.0 5.0 - 8.0   Protein, UA Negative Negative   Urobilinogen, UA 0.2 0.2 or 1.0 E.U./dL   Nitrite, UA negative    Leukocytes, UA Moderate (2+) (A) Negative   Appearance normal    Odor normal     Assessment & Plan     Acute cystitis UA +  leuks, + blood Rx macrobid bid x 5days Advised inc fluids Ordered culture  2 vertigo Refilled meclizine  Return as scheduled.      I, Mikey Kirschner, PA-C have reviewed all documentation for this visit. The documentation on  07/17/2021 for the exam, diagnosis, procedures, and orders are all accurate and complete.    Mikey Kirschner, PA-C  [facility] [phone] (phone) [phone] (fax)  [place]

## 2021-07-19 LAB — URINE CULTURE

## 2021-07-19 LAB — SPECIMEN STATUS REPORT

## 2021-09-18 ENCOUNTER — Encounter: Payer: Self-pay | Admitting: Family Medicine

## 2021-09-18 ENCOUNTER — Ambulatory Visit: Payer: Medicare PPO | Admitting: Family Medicine

## 2021-09-18 VITALS — BP 138/83 | HR 65 | Temp 98.0°F | Resp 16 | Wt 158.0 lb

## 2021-09-18 DIAGNOSIS — I700 Atherosclerosis of aorta: Secondary | ICD-10-CM | POA: Diagnosis not present

## 2021-09-18 DIAGNOSIS — G5711 Meralgia paresthetica, right lower limb: Secondary | ICD-10-CM

## 2021-09-18 DIAGNOSIS — J449 Chronic obstructive pulmonary disease, unspecified: Secondary | ICD-10-CM

## 2021-09-18 DIAGNOSIS — E7800 Pure hypercholesterolemia, unspecified: Secondary | ICD-10-CM | POA: Diagnosis not present

## 2021-09-18 NOTE — Assessment & Plan Note (Signed)
Noted on CT lung in 2019 ?Asymptomatic ?Not on statin ?

## 2021-09-18 NOTE — Assessment & Plan Note (Signed)
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

## 2021-09-18 NOTE — Assessment & Plan Note (Signed)
Reviewed last lipid panel Not currently on a statin Recheck FLP and CMP Discussed diet and exercise  

## 2021-09-18 NOTE — Progress Notes (Signed)
?  ? ?I,Sulibeya S Dimas,acting as a scribe for Shirlee Latch, MD.,have documented all relevant documentation on the behalf of Shirlee Latch, MD,as directed by  Shirlee Latch, MD while in the presence of Shirlee Latch, MD. ? ? ?Established patient visit ? ? ?Patient: Tasha Wallace   DOB: 01/09/1941   81 y.o. Female  MRN: [id number] ?Visit Date: 09/18/2021 ? ?Today's healthcare provider: Shirlee Latch, MD  ? ?Chief Complaint  ?Patient presents with  ? Hyperlipidemia  ? ?Subjective  ?  ?HPI  ?Lipid/Cholesterol, Follow-up ? ?Last lipid panel Other pertinent labs  ?Lab Results  ?Component Value Date  ? CHOL 279 (H) 04/10/2021  ? HDL 57 04/10/2021  ? LDLCALC 196 (H) 04/10/2021  ? TRIG 142 04/10/2021  ? CHOLHDL 4.9 (H) 04/10/2021  ? Lab Results  ?Component Value Date  ? ALT 15 04/10/2021  ? AST 19 04/10/2021  ? PLT 271 05/20/2020  ? TSH 2.240 05/20/2020  ?  ? ?She was last seen for this 10 months ago.  ?Management since that visit includes no changes. ? ?She reports excellent compliance with treatment. ?She is not having side effects. ? ?Symptoms: ?No chest pain No chest pressure/discomfort  ?No dyspnea No lower extremity edema  ?Yes numbness or tingling of extremity No orthopnea  ?No palpitations No paroxysmal nocturnal dyspnea  ?No speech difficulty No syncope  ? ?Current diet: in general, a "healthy" diet   ?Current exercise: no regular exercise ? ?The ASCVD Risk score (Arnett DK, et al., 2019) failed to calculate for the following reasons: ?  The 2019 ASCVD risk score is only valid for ages 55 to 21 ? ?--------------------------------------------------------------------------------------------------- ? ? ?Medications: ?Outpatient Medications Prior to Visit  ?Medication Sig  ? Brimonidine Tartrate (LUMIFY OP) Place 1 drop into both eyes daily as needed (redness).   ? meclizine (ANTIVERT) 25 MG tablet Take 1 tablet (25 mg total) by mouth 3 (three) times daily as needed for dizziness.  ? ?No  facility-administered medications prior to visit.  ? ? ?Review of Systems  ?Constitutional:  Negative for fatigue.  ?Respiratory:  Negative for chest tightness and shortness of breath.   ?Cardiovascular:  Negative for chest pain and palpitations.  ? ?  ?  Objective  ?  ?BP 138/83 (BP Location: Left Arm, Patient Position: Sitting, Cuff Size: Large)   Pulse 65   Temp 98 ?F (36.7 ?C) (Temporal)   Resp 16   Wt 158 lb (71.7 kg)   BMI 25.50 kg/m?  ?BP Readings from Last 3 Encounters:  ?09/18/21 138/83  ?07/17/21 135/68  ?11/07/20 (!) 152/71  ? ?Wt Readings from Last 3 Encounters:  ?09/18/21 158 lb (71.7 kg)  ?07/17/21 160 lb 1.6 oz (72.6 kg)  ?11/07/20 161 lb 9.6 oz (73.3 kg)  ? ?  ? ?Physical Exam ?Vitals reviewed.  ?Constitutional:   ?   General: She is not in acute distress. ?   Appearance: Normal appearance. She is well-developed. She is not diaphoretic.  ?HENT:  ?   Head: Normocephalic and atraumatic.  ?Eyes:  ?   General: No scleral icterus. ?   Conjunctiva/sclera: Conjunctivae normal.  ?Neck:  ?   Thyroid: No thyromegaly.  ?Cardiovascular:  ?   Rate and Rhythm: Normal rate and regular rhythm.  ?   Heart sounds: Normal heart sounds. No murmur heard. ?Pulmonary:  ?   Effort: Pulmonary effort is normal. No respiratory distress.  ?   Breath sounds: Normal breath sounds. No wheezing, rhonchi or rales.  ?Musculoskeletal:  ?  Cervical back: Neck supple.  ?   Right lower leg: No edema.  ?   Left lower leg: No edema.  ?Lymphadenopathy:  ?   Cervical: No cervical adenopathy.  ?Skin: ?   General: Skin is warm and dry.  ?   Findings: No rash.  ?Neurological:  ?   Mental Status: She is alert and oriented to person, place, and time. Mental status is at baseline.  ?Psychiatric:     ?   Mood and Affect: Mood normal.     ?   Behavior: Behavior normal.  ?  ? ? ?No results found for any visits on 09/18/21. ? Assessment & Plan  ?  ? ?Problem List Items Addressed This Visit   ? ?  ? Cardiovascular and Mediastinum  ? Aortic  atherosclerosis (HCC)  ?  Noted on CT lung in 2019 ?Asymptomatic ?Not on statin ?  ?  ?  ? Respiratory  ? COPD (chronic obstructive pulmonary disease) (HCC)  ?  Chronic and stable ?Quit smoking 15 years ago ?No indication for medications at this time ?  ?  ?  ? Nervous and Auditory  ? Meralgia paresthetica of right side  ?  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  ?  ?  ? Other  ? Hypercholesteremia - Primary  ?  Reviewed last lipid panel ?Not currently on a statin ?Recheck FLP and CMP ?Discussed diet and exercise  ?  ?  ? Relevant Orders  ? Comprehensive metabolic panel  ? Lipid panel  ?  ? ?Return in about 1 year (around 09/19/2022) for AWV, CPE.  ?   ? ?I, Shirlee Latch, MD, have reviewed all documentation for this visit. The documentation on 09/18/21 for the exam, diagnosis, procedures, and orders are all accurate and complete. ? ? ?Erasmo Downer, MD, MPH ?[facility] ?[facility]   ?

## 2021-09-18 NOTE — Assessment & Plan Note (Signed)
Chronic and stable ?Quit smoking 15 years ago ?No indication for medications at this time ?

## 2021-11-14 ENCOUNTER — Other Ambulatory Visit: Payer: Self-pay | Admitting: Family Medicine

## 2021-11-15 LAB — COMPREHENSIVE METABOLIC PANEL WITH GFR
ALT: 19 [IU]/L (ref 0–32)
AST: 22 [IU]/L (ref 0–40)
Albumin/Globulin Ratio: 1.5 (ref 1.2–2.2)
Albumin: 4 g/dL (ref 3.7–4.7)
Alkaline Phosphatase: 83 [IU]/L (ref 44–121)
BUN/Creatinine Ratio: 13 (ref 12–28)
BUN: 12 mg/dL (ref 8–27)
Bilirubin Total: 0.3 mg/dL (ref 0.0–1.2)
CO2: 24 mmol/L (ref 20–29)
Calcium: 9.6 mg/dL (ref 8.7–10.3)
Chloride: 106 mmol/L (ref 96–106)
Creatinine, Ser: 0.92 mg/dL (ref 0.57–1.00)
Globulin, Total: 2.7 g/dL (ref 1.5–4.5)
Glucose: 94 mg/dL (ref 70–99)
Potassium: 4.7 mmol/L (ref 3.5–5.2)
Sodium: 142 mmol/L (ref 134–144)
Total Protein: 6.7 g/dL (ref 6.0–8.5)
eGFR: 63 mL/min/{1.73_m2}

## 2021-11-15 LAB — LIPID PANEL
Chol/HDL Ratio: 4.2 ratio (ref 0.0–4.4)
Cholesterol, Total: 256 mg/dL — ABNORMAL HIGH (ref 100–199)
HDL: 61 mg/dL (ref >39)
LDL Chol Calc (NIH): 177 mg/dL — ABNORMAL HIGH (ref 0–99)
Triglycerides: 105 mg/dL (ref 0–149)
VLDL Cholesterol Cal: 18 mg/dL (ref 5–40)

## 2022-01-16 ENCOUNTER — Ambulatory Visit (INDEPENDENT_AMBULATORY_CARE_PROVIDER_SITE_OTHER): Payer: Medicare PPO

## 2022-01-16 VITALS — Wt 158.0 lb

## 2022-01-16 DIAGNOSIS — Z0000 Encounter for general adult medical examination without abnormal findings: Secondary | ICD-10-CM | POA: Diagnosis not present

## 2022-01-16 NOTE — Patient Instructions (Signed)
[name] , Thank you for taking time to come for your Medicare Wellness Visit. I appreciate your ongoing commitment to your health goals. Please review the following plan we discussed and let me know if I can assist you in the future.   Screening recommendations/referrals: Colonoscopy: aged out Mammogram: aged out Bone Density: declined referral Recommended yearly ophthalmology/optometry visit for glaucoma screening and checkup Recommended yearly dental visit for hygiene and checkup  Vaccinations: Influenza vaccine: 02/16/21 Pneumococcal vaccine: 10/24/20 Tdap vaccine: 10/14/15 Shingles vaccine: Zostavax 01/13/09 Shingrix 02/05/18, 04/09/18   Covid-19:06/22/19, 07/13/19, 04/11/20, 10/24/20  Advanced directives: no  Conditions/risks identified: none  Next appointment: Follow up in one year for your annual wellness visit 01/21/23 @ 1:30 in person   Preventive Care 80 Years and Older, Female Preventive care refers to lifestyle choices and visits with your health care provider that can promote health and wellness. What does preventive care include? A yearly physical exam. This is also called an annual well check. Dental exams once or twice a year. Routine eye exams. Ask your health care provider how often you should have your eyes checked. Personal lifestyle choices, including: Daily care of your teeth and gums. Regular physical activity. Eating a healthy diet. Avoiding tobacco and drug use. Limiting alcohol use. Practicing safe sex. Taking low-dose aspirin every day. Taking vitamin and mineral supplements as recommended by your health care provider. What happens during an annual well check? The services and screenings done by your health care provider during your annual well check will depend on your age, overall health, lifestyle risk factors, and family history of disease. Counseling  Your health care provider may ask you questions about your: Alcohol use. Tobacco use. Drug  use. Emotional well-being. Home and relationship well-being. Sexual activity. Eating habits. History of falls. Memory and ability to understand (cognition). Work and work Astronomer. Reproductive health. Screening  You may have the following tests or measurements: Height, weight, and BMI. Blood pressure. Lipid and cholesterol levels. These may be checked every 5 years, or more frequently if you are over 25 years old. Skin check. Lung cancer screening. You may have this screening every year starting at age 80 if you have a 30-pack-year history of smoking and currently smoke or have quit within the past 15 years. Fecal occult blood test (FOBT) of the stool. You may have this test every year starting at age 80. Flexible sigmoidoscopy or colonoscopy. You may have a sigmoidoscopy every 5 years or a colonoscopy every 10 years starting at age 80. Hepatitis C blood test. Hepatitis B blood test. Sexually transmitted disease (STD) testing. Diabetes screening. This is done by checking your blood sugar (glucose) after you have not eaten for a while (fasting). You may have this done every 1-3 years. Bone density scan. This is done to screen for osteoporosis. You may have this done starting at age 80. Mammogram. This may be done every 1-2 years. Talk to your health care provider about how often you should have regular mammograms. Talk with your health care provider about your test results, treatment options, and if necessary, the need for more tests. Vaccines  Your health care provider may recommend certain vaccines, such as: Influenza vaccine. This is recommended every year. Tetanus, diphtheria, and acellular pertussis (Tdap, Td) vaccine. You may need a Td booster every 10 years. Zoster vaccine. You may need this after age 80. Pneumococcal 13-valent conjugate (PCV13) vaccine. One dose is recommended after age 80. Pneumococcal polysaccharide (PPSV23) vaccine. One dose is recommended after  age  80. Talk to your health care provider about which screenings and vaccines you need and how often you need them. This information is not intended to replace advice given to you by your health care provider. Make sure you discuss any questions you have with your health care provider. Document Released: 07/01/2015 Document Revised: 02/22/2016 Document Reviewed: 04/05/2015 Elsevier Interactive Patient Education  2017 [employer] Prevention in the Home Falls can cause injuries. They can happen to people of all ages. There are many things you can do to make your home safe and to help prevent falls. What can I do on the outside of my home? Regularly fix the edges of walkways and driveways and fix any cracks. Remove anything that might make you trip as you walk through a door, such as a raised step or threshold. Trim any bushes or trees on the path to your home. Use bright outdoor lighting. Clear any walking paths of anything that might make someone trip, such as rocks or tools. Regularly check to see if handrails are loose or broken. Make sure that both sides of any steps have handrails. Any raised decks and porches should have guardrails on the edges. Have any leaves, snow, or ice cleared regularly. Use sand or salt on walking paths during winter. Clean up any spills in your garage right away. This includes oil or grease spills. What can I do in the bathroom? Use night lights. Install grab bars by the toilet and in the tub and shower. Do not use towel bars as grab bars. Use non-skid mats or decals in the tub or shower. If you need to sit down in the shower, use a plastic, non-slip stool. Keep the floor dry. Clean up any water that spills on the floor as soon as it happens. Remove soap buildup in the tub or shower regularly. Attach bath mats securely with double-sided non-slip rug tape. Do not have throw rugs and other things on the floor that can make you trip. What can I do in the  bedroom? Use night lights. Make sure that you have a light by your bed that is easy to reach. Do not use any sheets or blankets that are too big for your bed. They should not hang down onto the floor. Have a firm chair that has side arms. You can use this for support while you get dressed. Do not have throw rugs and other things on the floor that can make you trip. What can I do in the kitchen? Clean up any spills right away. Avoid walking on wet floors. Keep items that you use a lot in easy-to-reach places. If you need to reach something above you, use a strong step stool that has a grab bar. Keep electrical cords out of the way. Do not use floor polish or wax that makes floors slippery. If you must use wax, use non-skid floor wax. Do not have throw rugs and other things on the floor that can make you trip. What can I do with my stairs? Do not leave any items on the stairs. Make sure that there are handrails on both sides of the stairs and use them. Fix handrails that are broken or loose. Make sure that handrails are as long as the stairways. Check any carpeting to make sure that it is firmly attached to the stairs. Fix any carpet that is loose or worn. Avoid having throw rugs at the top or bottom of the stairs. If you do  have throw rugs, attach them to the floor with carpet tape. Make sure that you have a light switch at the top of the stairs and the bottom of the stairs. If you do not have them, ask someone to add them for you. What else can I do to help prevent falls? Wear shoes that: Do not have high heels. Have rubber bottoms. Are comfortable and fit you well. Are closed at the toe. Do not wear sandals. If you use a stepladder: Make sure that it is fully opened. Do not climb a closed stepladder. Make sure that both sides of the stepladder are locked into place. Ask someone to hold it for you, if possible. Clearly mark and make sure that you can see: Any grab bars or  handrails. First and last steps. Where the edge of each step is. Use tools that help you move around (mobility aids) if they are needed. These include: Canes. Walkers. Scooters. Crutches. Turn on the lights when you go into a dark area. Replace any light bulbs as soon as they burn out. Set up your furniture so you have a clear path. Avoid moving your furniture around. If any of your floors are uneven, fix them. If there are any pets around you, be aware of where they are. Review your medicines with your doctor. Some medicines can make you feel dizzy. This can increase your chance of falling. Ask your doctor what other things that you can do to help prevent falls. This information is not intended to replace advice given to you by your health care provider. Make sure you discuss any questions you have with your health care provider. Document Released: 03/31/2009 Document Revised: 11/10/2015 Document Reviewed: 07/09/2014 Elsevier Interactive Patient Education  2017 [employer].

## 2022-01-16 NOTE — Progress Notes (Signed)
Virtual Visit via Telephone Note  I connected with  Tasha Wallace on 01/16/22 at 10:00 AM EDT by telephone and verified that I am speaking with the correct person using two identifiers.  Location: Patient: home  Provider: [facility] Persons participating in the virtual visit: patient/Nurse Health Advisor   I discussed the limitations, risks, security and privacy concerns of performing an evaluation and management service by telephone and the availability of in person appointments. The patient expressed understanding and agreed to proceed.  Interactive audio and video telecommunications were attempted between this nurse and patient, however failed, due to patient having technical difficulties OR patient did not have access to video capability.  We continued and completed visit with audio only.  Some vital signs may be absent or patient reported.   Hal Hope, LPN  Subjective:   Tasha Wallace is a 81 y.o. female who presents for Medicare Annual (Subsequent) preventive examination.  Review of Systems           Objective:    There were no vitals filed for this visit. There is no height or weight on file to calculate BMI.     02/04/2018   11:10 AM 08/10/2016    1:17 PM 10/14/2015    4:26 PM 06/15/2015   11:08 AM  Advanced Directives  Does Patient Have a Medical Advance Directive? Yes Yes No Yes  Type of Advance Directive Healthcare Power of Attorney Living will;Healthcare Power of Attorney  Living will;Healthcare Power of Attorney  Does patient want to make changes to medical advance directive? No - Patient declined     Copy of Healthcare Power of Attorney in Chart? Yes No - copy requested    Would patient like information on creating a medical advance directive?   No - patient declined information     Current Medications (verified) Outpatient Encounter Medications as of 01/16/2022  Medication Sig   Brimonidine Tartrate (LUMIFY OP) Place 1 drop into both eyes daily as needed  (redness).    meclizine (ANTIVERT) 25 MG tablet Take 1 tablet (25 mg total) by mouth 3 (three) times daily as needed for dizziness.   Omeprazole Magnesium (PRILOSEC PO)  (Patient not taking: Reported on 01/16/2022)   No facility-administered encounter medications on file as of 01/16/2022.    Allergies (verified) Metronidazole   History: Past Medical History:  Diagnosis Date   Anxiety    GERD (gastroesophageal reflux disease)    Hyperlipidemia    Past Surgical History:  Procedure Laterality Date   CATARACT EXTRACTION W/PHACO Right 12/31/2017   Procedure: CATARACT EXTRACTION PHACO AND INTRAOCULAR LENS PLACEMENT (IOC);  Surgeon: Galen Manila, MD;  Location: [facility] ORS;  Service: Ophthalmology;  Laterality: Right;  [place] 00:43.2 AP% 16.0 CDE 6.91 Fluid pack lot # [id number]   CATARACT EXTRACTION W/PHACO Left 02/04/2018   Procedure: CATARACT EXTRACTION PHACO AND INTRAOCULAR LENS PLACEMENT (IOC);  Surgeon: Galen Manila, MD;  Location: [facility] ORS;  Service: Ophthalmology;  Laterality: Left;  [place] 00:46.6 AP% 17.6 CDE 8.19 Fluid Pack lot # [id number]   colonoscopy     GANGLION CYST EXCISION  1986   IRRIGATION AND DEBRIDEMENT SEBACEOUS CYST  1990   TONSILLECTOMY AND ADENOIDECTOMY  1950   URETHRA SURGERY  1971   Family History  Problem Relation Age of Onset   Hypertension Mother    CVA Mother    Heart disease Father    Social History   Socioeconomic History   Marital status: Divorced    Spouse name: Not on  file   Number of children: Not on file   Years of education: Not on file   Highest education level: Not on file  Occupational History   Not on file  Tobacco Use   Smoking status: Former    Packs/day: 1.00    Years: 35.00    Total pack years: 35.00    Types: Cigarettes    Quit date: 06/17/2012    Years since quitting: 9.5   Smokeless tobacco: Never  Vaping Use   Vaping Use: Never used  Substance and Sexual Activity   Alcohol use: Yes    Alcohol/week: 2.0 standard drinks  of alcohol    Types: 2 Shots of liquor per week   Drug use: No   Sexual activity: Not on file  Other Topics Concern   Not on file  Social History Narrative   Not on file   Social Determinants of Health   Financial Resource Strain: Not on file  Food Insecurity: Not on file  Transportation Needs: Not on file  Physical Activity: Insufficiently Active (01/16/2022)   Exercise Vital Sign    Days of Exercise per Week: 3 days    Minutes of Exercise per Session: 30 min  Stress: No Stress Concern Present (01/16/2022)   [employer] of Occupational Health - Occupational Stress Questionnaire    Feeling of Stress : Not at all  Social Connections: Not on file    Tobacco Counseling Counseling given: Not Answered   Clinical Intake:  Pre-visit preparation completed: Yes  Pain : No/denies pain     Nutritional Risks: None Diabetes: No  How often do you need to have someone help you when you read instructions, pamphlets, or other written materials from your doctor or pharmacy?: 1 - Never  Diabetic?no  Interpreter Needed?: No  Information entered by :: Kennedy Bucker, LPN   Activities of Daily Living    09/18/2021    1:08 PM 07/17/2021    2:18 PM  In your present state of health, do you have any difficulty performing the following activities:  Hearing? 0 0  Vision? 0 0  Comment  wears glasses  Difficulty concentrating or making decisions? 0 0  Walking or climbing stairs? 0 0  Dressing or bathing? 0 0  Doing errands, shopping? 0 0    Patient Care Team: Erasmo Downer, MD as PCP - General (Family Medicine)  Indicate any recent Medical Services you may have received from other than [facility] providers in the past year (date may be approximate).     Assessment:   This is a routine wellness examination for Tasha Wallace.  Hearing/Vision screen No results found.  Dietary issues and exercise activities discussed:     Goals Addressed             This Visit's Progress     DIET - EAT MORE FRUITS AND VEGETABLES         Depression Screen    01/16/2022   10:00 AM 09/18/2021    1:08 PM 07/17/2021    2:17 PM 05/20/2020   10:26 AM 05/07/2019    2:19 PM 08/12/2017    2:02 PM 08/10/2016    1:18 PM  PHQ 2/9 Scores  PHQ - 2 Score 0 0 0 0 0 0 0  PHQ- 9 Score 0 0 0   0     Fall Risk    09/18/2021    1:09 PM 07/17/2021    2:17 PM 05/20/2020   10:14 AM 05/07/2019  2:19 PM 08/12/2017    2:01 PM  Fall Risk   Falls in the past year? 0 0 0 1 No  Number falls in past yr: 0 0 0 1   Injury with Fall? 0 0 0 0   Risk for fall due to :  No Fall Risks No Fall Risks    Follow up Falls evaluation completed  Falls evaluation completed Falls evaluation completed     FALL RISK PREVENTION PERTAINING TO THE HOME:  Any stairs in or around the home? No  If so, are there any without handrails? No  Home free of loose throw rugs in walkways, pet beds, electrical cords, etc? Yes  Adequate lighting in your home to reduce risk of falls? Yes   ASSISTIVE DEVICES UTILIZED TO PREVENT FALLS:  Life alert? No  Use of a cane, walker or w/c? No  Grab bars in the bathroom? Yes  Shower chair or bench in shower? Yes  Elevated toilet seat or a handicapped toilet? Yes   Cognitive Function:declined        08/10/2016    1:24 PM  6CIT Screen  What Year? 0 points  What month? 0 points  What time? 0 points  Count back from 20 0 points  Months in reverse 0 points  Repeat phrase 4 points  Total Score 4 points    Immunizations Immunization History  Administered Date(s) Administered   Influenza, High Dose Seasonal PF 04/06/2019   Influenza,inj,quad, With Preservative 02/17/2018   Influenza-Unspecified 04/06/2019, 02/16/2021   PFIZER(Purple Top)SARS-COV-2 Vaccination 06/22/2019, 07/13/2019   Pfizer Covid-19 Vaccine Bivalent Booster 92yrs & up 04/11/2020, 10/24/2020   Pneumococcal Conjugate PCV 7 03/26/2018   Pneumococcal Conjugate-13 07/31/2013, 03/19/2017   Pneumococcal  Polysaccharide-23 07/08/2009, 10/24/2020   Td 05/18/2004, 07/31/2013   Tdap 07/31/2013, 10/14/2015   Zoster Recombinat (Shingrix) 02/05/2018, 04/09/2018   Zoster, Live 06/27/2007, 01/13/2009    TDAP status: Up to date  Flu Vaccine status: Up to date  Pneumococcal vaccine status: Up to date  Covid-19 vaccine status: Completed vaccines  Qualifies for Shingles Vaccine? Yes   Zostavax completed Yes   Shingrix Completed?: Yes  Screening Tests Health Maintenance  Topic Date Due   INFLUENZA VACCINE  01/16/2022   TETANUS/TDAP  10/13/2025   Pneumonia Vaccine 52+ Years old  Completed   DEXA SCAN  Completed   COVID-19 Vaccine  Completed   Zoster Vaccines- Shingrix  Completed   HPV VACCINES  Aged Out    Health Maintenance  Health Maintenance Due  Topic Date Due   INFLUENZA VACCINE  01/16/2022    Colorectal cancer screening: No longer required.   Mammogram status: No longer required due to age.  Bone Density status: Completed 08/26/13. Results reflect: Bone density results: OSTEOPENIA. Repeat every 5 years.- declined referral  Lung Cancer Screening: (Low Dose CT Chest recommended if Age 72-80 years, 30 pack-year currently smoking OR have quit w/in 15years.) does qualify.   Lung Cancer Screening Referral: sent  Additional Screening:  Hepatitis C Screening: does not qualify; Completed no  Vision Screening: Recommended annual ophthalmology exams for early detection of glaucoma and other disorders of the eye. Is the patient up to date with their annual eye exam?  Yes  Who is the provider or what is the name of the office in which the patient attends annual eye exams? [facility] If pt is not established with a provider, would they like to be referred to a provider to establish care? No .   Dental Screening:  Recommended annual dental exams for proper oral hygiene  Community Resource Referral / Chronic Care Management: CRR required this visit?  No   CCM required this visit?   No      Plan:     I have personally reviewed and noted the following in the patient's chart:   Medical and social history Use of alcohol, tobacco or illicit drugs  Current medications and supplements including opioid prescriptions.  Functional ability and status Nutritional status Physical activity Advanced directives List of other physicians Hospitalizations, surgeries, and ER visits in previous 12 months Vitals Screenings to include cognitive, depression, and falls Referrals and appointments  In addition, I have reviewed and discussed with patient certain preventive protocols, quality metrics, and best practice recommendations. A written personalized care plan for preventive services as well as general preventive health recommendations were provided to patient.     Hal Hope, LPN   0/01/1447   Nurse Notes: none

## 2022-02-05 IMAGING — CT CT CHEST LUNG CANCER SCREENING LOW DOSE W/O CM
2 of 5 series · 15 of 40 positions shown, 18 images · non-contrast
Comparison: 07/03/2018

CLINICAL DATA: Lung cancer screening. Former smoker, asymptomatic.
Thirty-five pack-year history.

EXAM:
CT CHEST WITHOUT CONTRAST LOW-DOSE FOR LUNG CANCER SCREENING
TECHNIQUE: Multidetector CT imaging of the chest was performed following the
standard protocol without IV contrast.

[Series 3: lung 1.00 · axial · 0.53mm/px · z∈[-1197,-881]mm · 12 of 348 slices shown, 15 images]
[im 16/348  mediastinal]
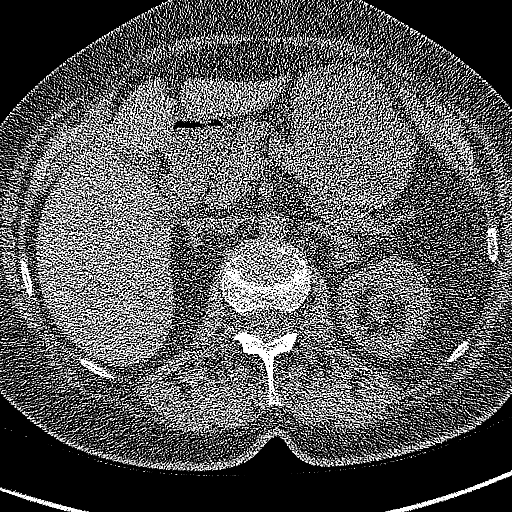
[im 16/348  lung]
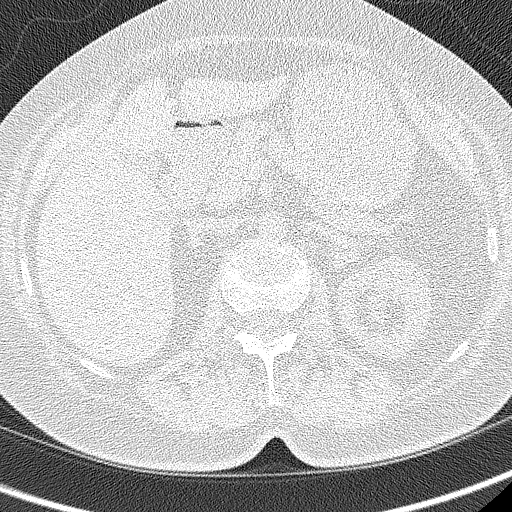
[im 48/348  lung]
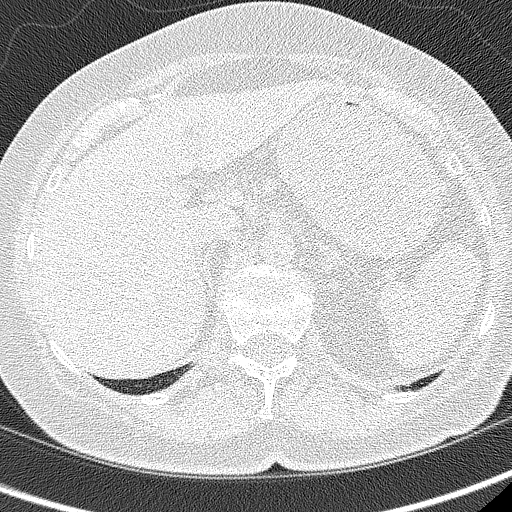
[im 79/348  lung]
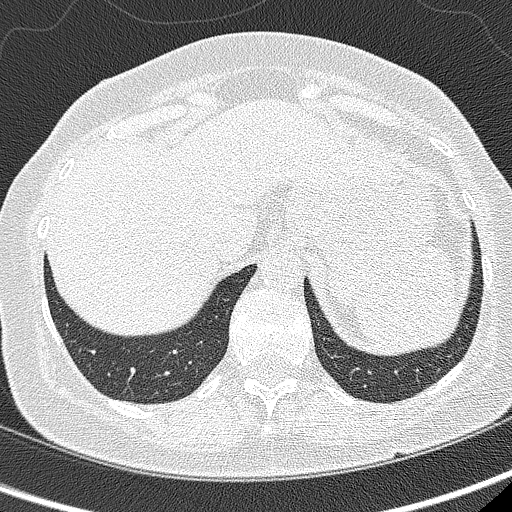
[im 111/348  lung]
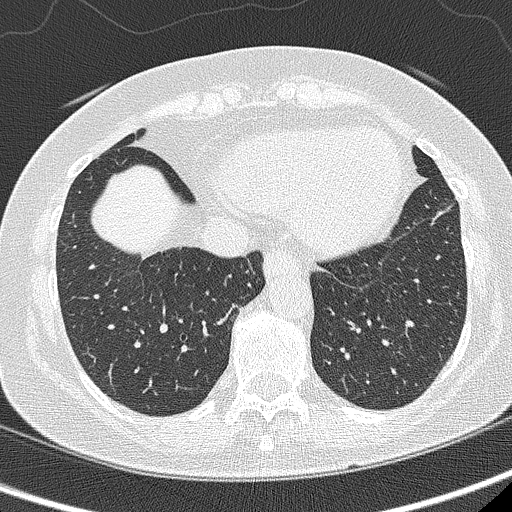
[im 127/348  mediastinal]
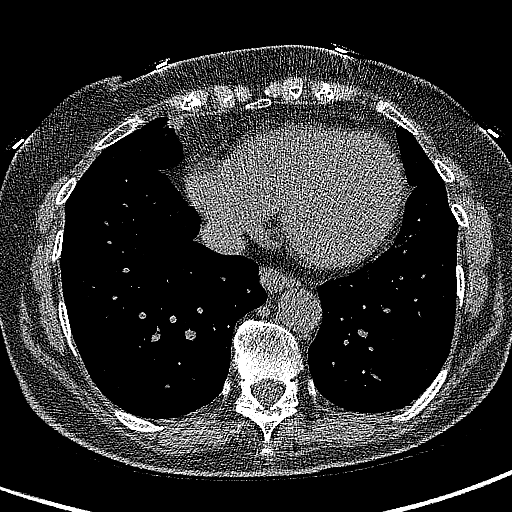
[im 127/348  lung]
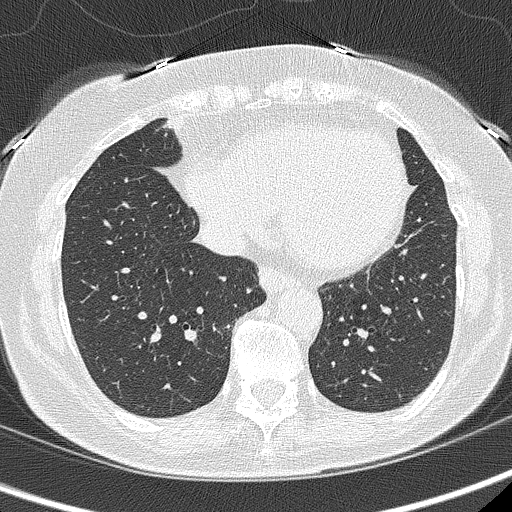
[im 158/348  lung]
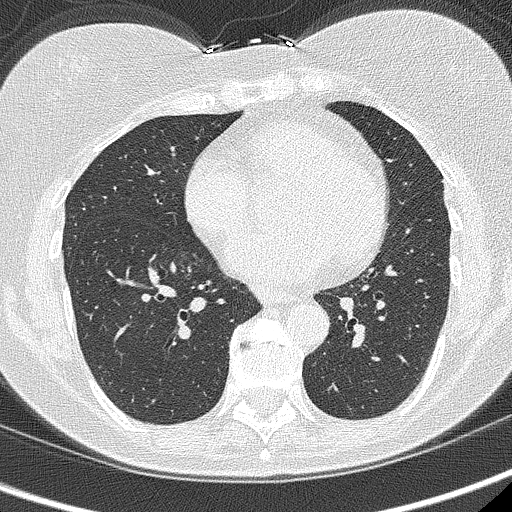
[im 190/348  lung]
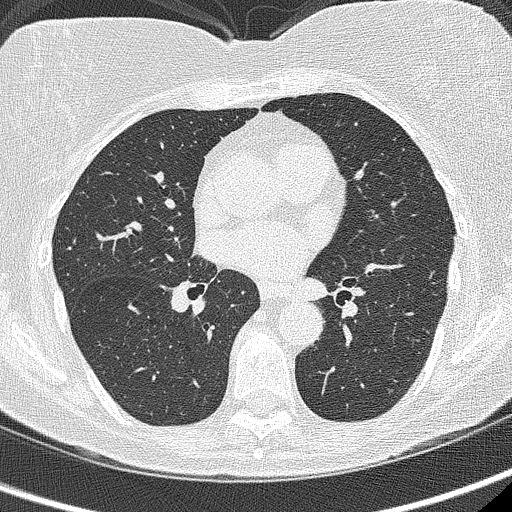
[im 221/348  lung]
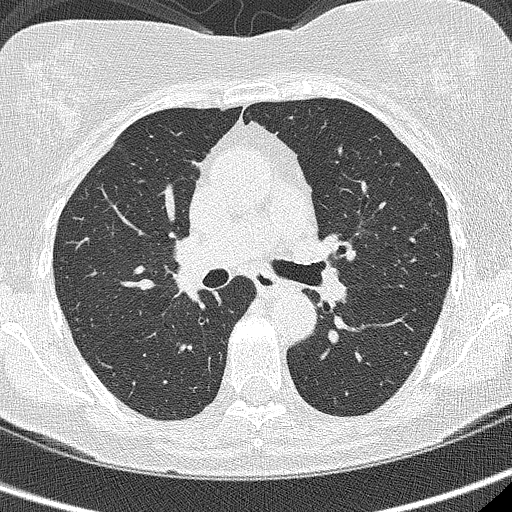
[im 237/348  mediastinal]
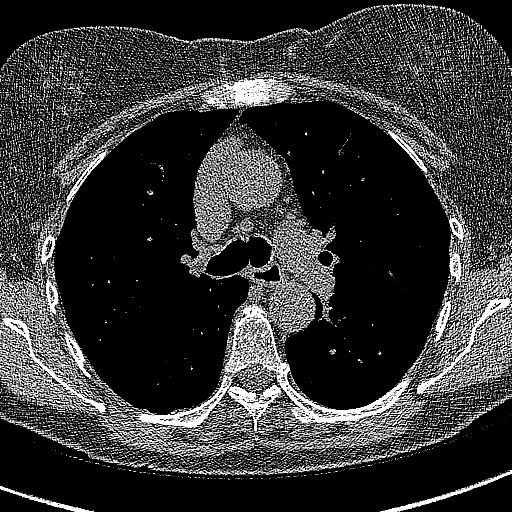
[im 237/348  lung]
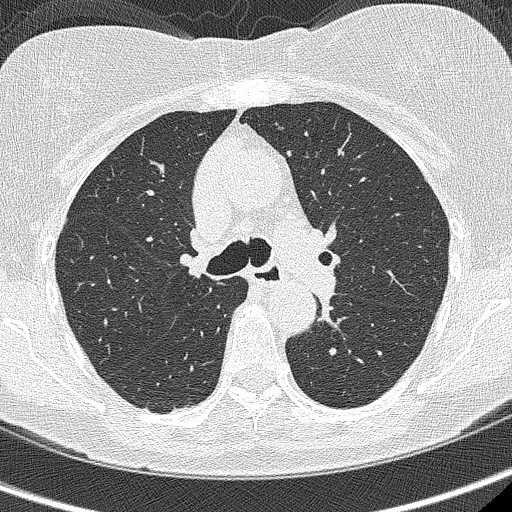
[im 269/348  lung]
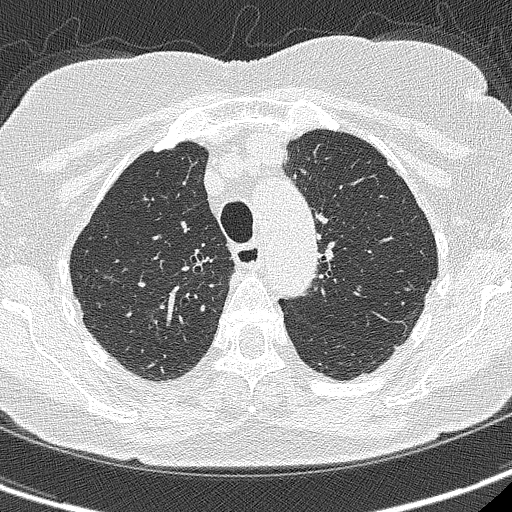
[im 300/348  lung]
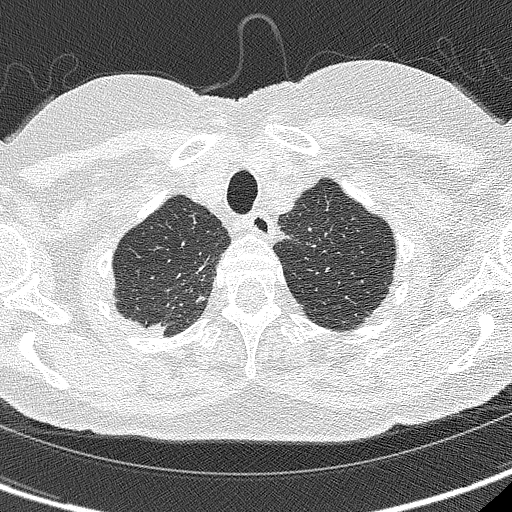
[im 332/348  lung]
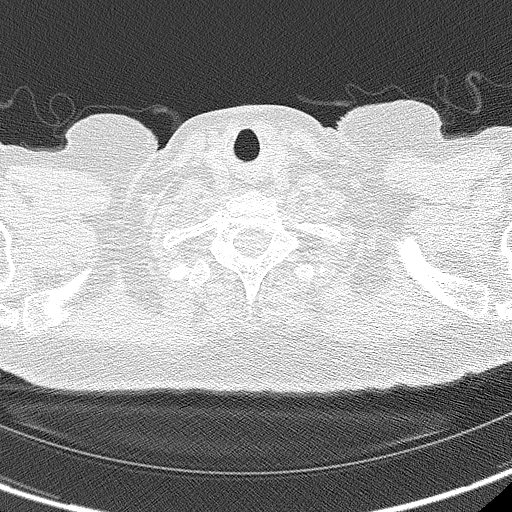

[Series 4: coronals lung 1.00 cor · coronal · 0.53mm/px · 3 of 247 slices shown]
[im 50/247  lung]
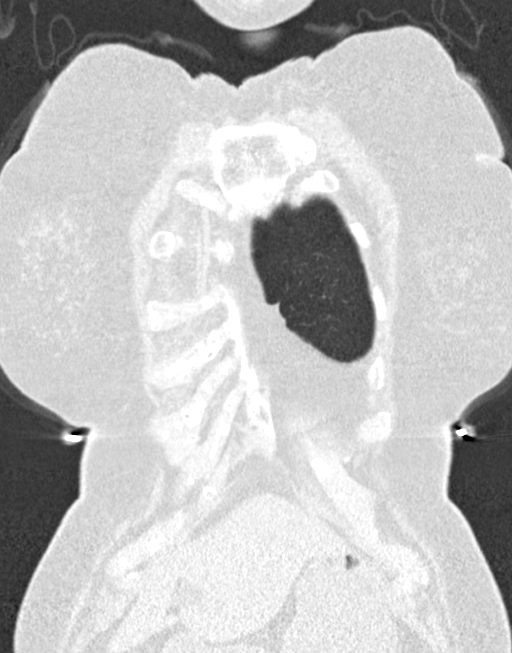
[im 99/247  lung]
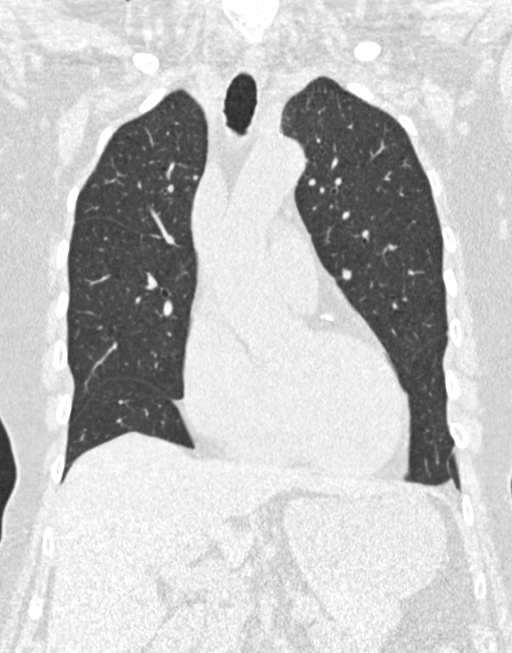
[im 148/247  lung]
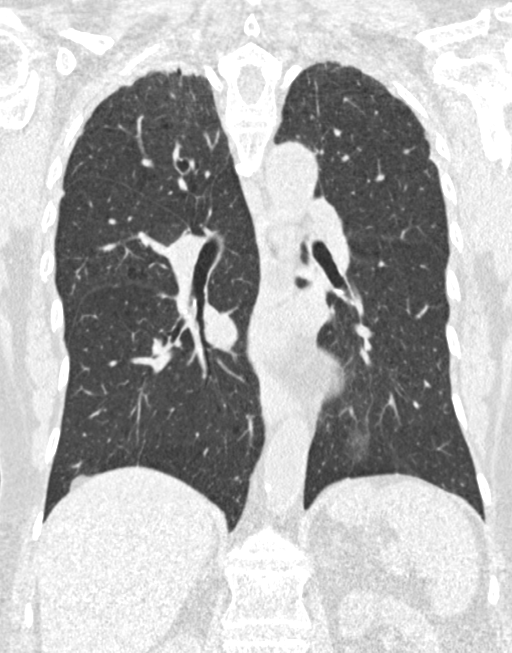

[15 of 40 positions shown; findings below may reference images not displayed]

FINDINGS: Cardiovascular: Mild cardiac enlargement. Mild aortic
atherosclerosis. Lad, RCA and left circumflex coronary artery
calcifications.

Mediastinum/Nodes: No enlarged mediastinal, hilar, or axillary lymph
nodes. Thyroid gland, trachea, and esophagus demonstrate no
significant findings.

Lungs/Pleura: No pleural effusion, airspace consolidation or
atelectasis. Diffuse bronchial wall thickening identified. Mild
centrilobular emphysema. Several small lung nodules are noted. The
largest is in the central left upper lobe with an equivalent
diameter of 2.6 mm. No new suspicious lung nodules

Upper Abdomen: Simple cyst identified adjacent to the falciform
ligament within lateral segment of left lobe of liver. No acute or
suspicious findings.

Musculoskeletal: No chest wall mass or suspicious bone lesions
identified. Degenerative disc disease noted within the midthoracic
spine.
IMPRESSION: 1. Lung-RADS 2, benign appearance or behavior. Continue annual
screening with low-dose chest CT without contrast in 12 months.
2. Diffuse bronchial wall thickening with emphysema, as above;
imaging findings suggestive of underlying COPD.
3. Multi vessel coronary artery calcifications.
4. Emphysema and aortic atherosclerosis.

Aortic Atherosclerosis (5TMRS-N26.6) and Emphysema (5TMRS-3IR.Q).

## 2022-02-13 ENCOUNTER — Emergency Department: Payer: Medicare PPO

## 2022-02-13 ENCOUNTER — Ambulatory Visit: Payer: Self-pay

## 2022-02-13 ENCOUNTER — Other Ambulatory Visit: Payer: Self-pay

## 2022-02-13 ENCOUNTER — Emergency Department
Admission: EM | Admit: 2022-02-13 | Discharge: 2022-02-13 | Disposition: A | Discharge status: Home / Self Care | Payer: Medicare PPO | Attending: Emergency Medicine | Admitting: Emergency Medicine

## 2022-02-13 DIAGNOSIS — S0990XA Unspecified injury of head, initial encounter: Secondary | ICD-10-CM | POA: Insufficient documentation

## 2022-02-13 DIAGNOSIS — W01198A Fall on same level from slipping, tripping and stumbling with subsequent striking against other object, initial encounter: Secondary | ICD-10-CM | POA: Diagnosis not present

## 2022-02-13 NOTE — Telephone Encounter (Signed)
Chief Complaint: fell- head injury Symptoms: >2 inch sized lump to top/back of head, left ankle sore Frequency: fall occurred yesterday at 1530 Pertinent Negatives: Patient denies LOC, vomiting, neck pain,  Disposition: [x] ED /[] Urgent Care (no appt availability in office) / [] Appointment(In office/virtual)/ []  [facility]/ [] Home Care/ [] Refused Recommended Disposition /[] [place] [facility]/ []  Follow-up with PCP Additional Notes: Pt to go to ED for evaluation Reason for Disposition  Large swelling or bruise > 2 inches (5 cm)  Answer Assessment - Initial Assessment Questions 1. MECHANISM: "How did the injury happen?" For falls, ask: "What height did you fall from?" and "What surface did you fall against?"      From chair- 2. ONSET: "When did the injury happen?" (Minutes or hours ago)      yesterday 3. NEUROLOGIC SYMPTOMS: "Was there any loss of consciousness?" "Are there any other neurological symptoms?"      No LOC 4. MENTAL STATUS: "Does the person know who they are, who you are, and where they are?"      yes 5. LOCATION: "What part of the head was hit?"      Top back 6. SCALP APPEARANCE: "What does the scalp look like? Is it bleeding now?" If Yes, ask: "Is it difficult to stop?"      Pt unable to see 7. SIZE: For cuts, bruises, or swelling, ask: "How large is it?" (e.g., inches or centimeters)       > 2 inches 8. PAIN: "Is there any pain?" If Yes, ask: "How bad is it?"  (e.g., Scale 1-10; or mild, moderate, severe)     Mild to touch 9. TETANUS: For any breaks in the skin, ask: "When was the last tetanus booster?"     N/a 10. OTHER SYMPTOMS: "Do you have any other symptoms?" (e.g., neck pain, vomiting)       no 11. PREGNANCY: "Is there any chance you are pregnant?" "When was your last menstrual period?"       N/a  Answer Assessment - Initial Assessment Questions 1. MECHANISM: "How did the fall happen?"    Was about to stand in chair and chair topples and pt  fell and hit head 2. DOMESTIC VIOLENCE AND ELDER ABUSE SCREENING: "Did you fall because someone pushed you or tried to hurt you?" If Yes, ask: "Are you safe now?"     N/a 3. ONSET: "When did the fall happen?" (e.g., minutes, hours, or days ago)     1530 4. LOCATION: "What part of the body hit the ground?" (e.g., back, buttocks, head, hips, knees, hands, head, stomach)     Whole body 5. INJURY: "Did you hurt (injure) yourself when you fell?" If Yes, ask: "What did you injure? Tell me more about this?" (e.g., body area; type of injury; pain severity)"     Back of top- lump  6. PAIN: "Is there any pain?" If Yes, ask: "How bad is the pain?" (e.g., Scale 1-10; or mild,  moderate, severe)   - NONE (0): No pain   - MILD (1-3): Doesn't interfere with normal activities    - MODERATE (4-7): Interferes with normal activities or awakens from sleep    - SEVERE (8-10): Excruciating pain, unable to do any normal activities      None except to touch 7. SIZE: For cuts, bruises, or swelling, ask: "How large is it?" (e.g., inches or centimeters)      Unable to see  8. PREGNANCY: "Is there any chance you are pregnant?" "  When was your last menstrual period?"     N/a 9. OTHER SYMPTOMS: "Do you have any other symptoms?" (e.g., dizziness, fever, weakness; new onset or worsening).      No- left ankle sore 10. CAUSE: "What do you think caused the fall (or falling)?" (e.g., tripped, dizzy spell)       Chair tipped back  Protocols used: Falls and Falling-A-AH, Head Injury-A-AH

## 2022-02-13 NOTE — ED Provider Notes (Signed)
[facility] Provider Note    Event Date/Time   First MD Initiated Contact with Patient 02/13/22 1004     (approximate)  History   Chief Complaint: Head Injury  HPI  Tasha Wallace is a 81 y.o. female with a past medical history of anxiety, gastric reflux, hyperlipidemia, presents to the emergency department after head injury.  According to the patient yesterday she was stepping up on a chair when it slipped out from under her causing her to fall down onto the right side of her head.  Patient denies LOC although states she did see "stars."  Patient denies any nausea or vomiting.  Patient's neighbor is a nurse who recommended she come to the emergency department for evaluation given the large hematoma on the right side of her head.  Patient states overnight the hematoma has gone down but she still came to the emergency department for evaluation.  Patient denies any nausea vomiting denies any headache denies any neck pain.  No anticoagulation.  Physical Exam   Triage Vital Signs: ED Triage Vitals  Enc Vitals Group     BP 02/13/22 0931 (!) 149/97     Pulse Rate 02/13/22 0931 61     Resp 02/13/22 0931 18     Temp 02/13/22 0931 97.8 F (36.6 C)     Temp src --      SpO2 02/13/22 0931 95 %     Weight --      Height --      Head Circumference --      Peak Flow --      Pain Score 02/13/22 0930 0     Pain Loc --      Pain Edu? --      Excl. in GC? --     Most recent vital signs: Vitals:   02/13/22 0931  BP: (!) 149/97  Pulse: 61  Resp: 18  Temp: 97.8 F (36.6 C)  SpO2: 95%    General: Awake, no distress.  CV:  Good peripheral perfusion.  Regular rate and rhythm  Resp:  Normal effort.  Equal breath sounds bilaterally.  Abd:  No distention.  Soft, nontender.  No rebound or guarding. Other:  Small hematoma to the right parietal scalp, no other concerning findings on exam.   ED Results / Procedures / Treatments   RADIOLOGY  I have reviewed the CT  images on my interpretation I do not see any large bleed. CT scan read as negative by radiology.   MEDICATIONS ORDERED IN ED: Medications - No data to display   IMPRESSION / MDM / ASSESSMENT AND PLAN / ED COURSE  I reviewed the triage vital signs and the nursing notes.  Patient's presentation is most consistent with acute presentation with potential threat to life or bodily function.  Patient presents emergency department after head injury.  Patient overall appears well, states she saw stars but no LOC no nausea no vomiting no headache.  Discussed with the patient CT imaging, patient agreeable to plan of care.  We will obtain a CT scan of the head as a precaution given the patient's age and head injury.  CT scan of the head is negative for acute abnormality.  Patient continues to appear well.  We will discharge home with supportive care.  Patient agreeable to plan of care.  FINAL CLINICAL IMPRESSION(S) / ED DIAGNOSES   Head injury   Note:  This document was prepared using Dragon voice recognition software and may  include unintentional dictation errors.   Minna Antis, MD 02/13/22 1118

## 2022-02-13 NOTE — ED Triage Notes (Signed)
Pt comes with c/o trip and fall yesterday. Pt states she hit her right upper side of head. Pt states no loco or thinners.

## 2022-12-10 IMAGING — CR DG TIBIA/FIBULA 2V*L*
1 series · 4 of 4 positions shown · non-contrast
Comparison: None.

CLINICAL DATA: Leg pain common no known injury, initial encounter

EXAM:
LEFT TIBIA AND FIBULA - 2 VIEW

[Series 1: dg tibia/fibula left · 0.14mm/px · 4 of 4 slices shown]
[im 1/4]
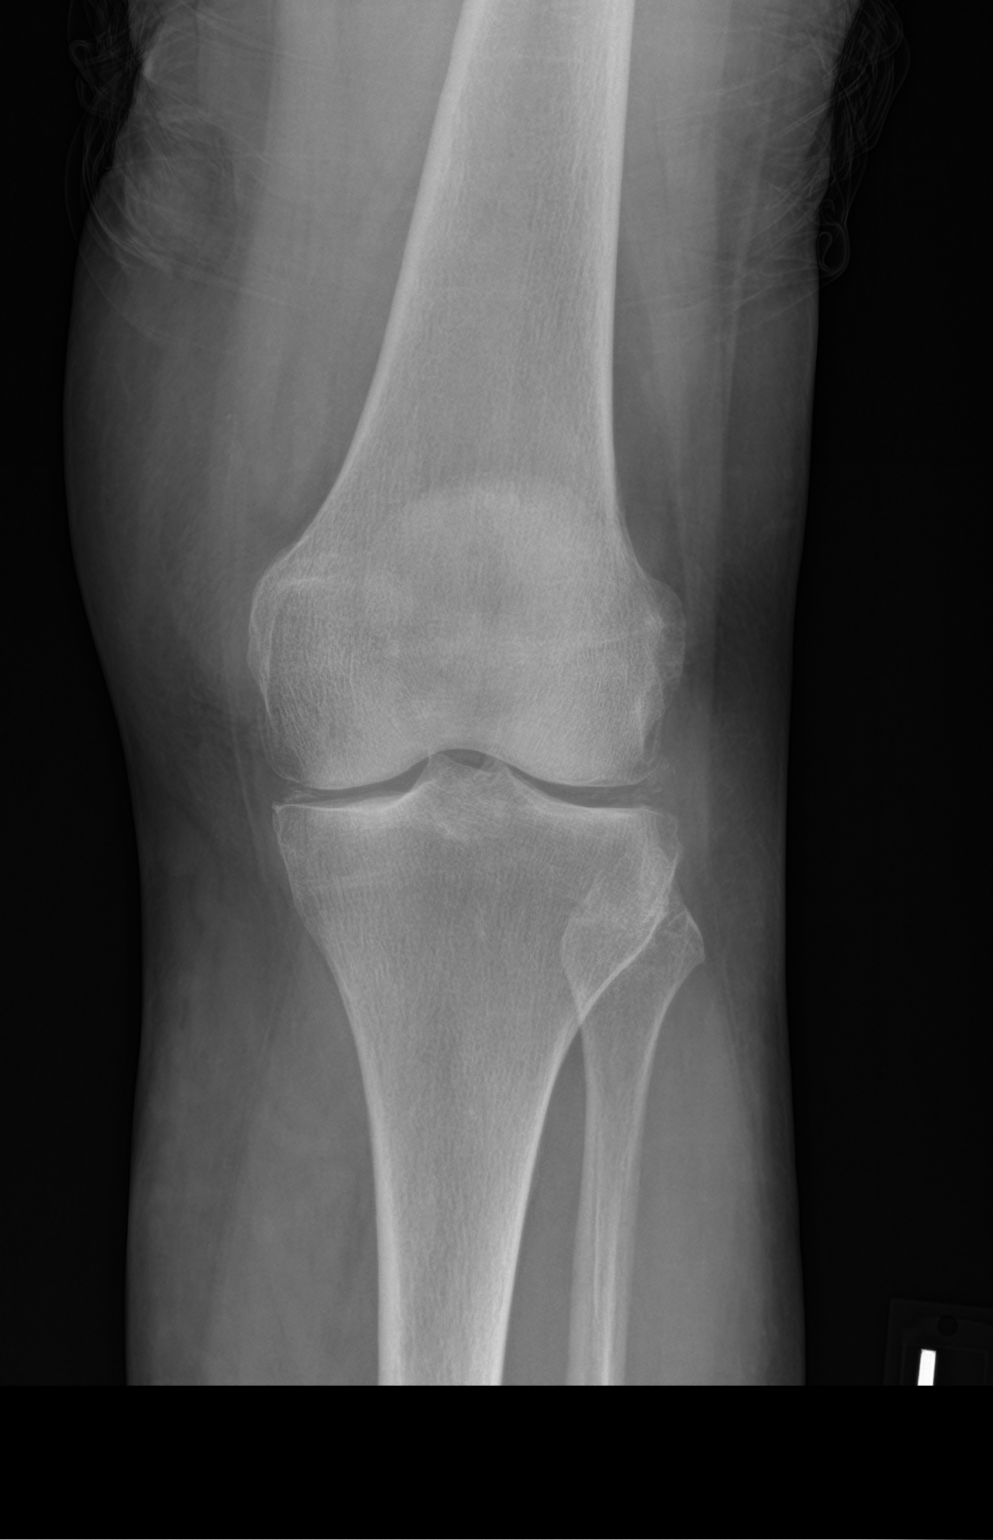
[im 2/4]
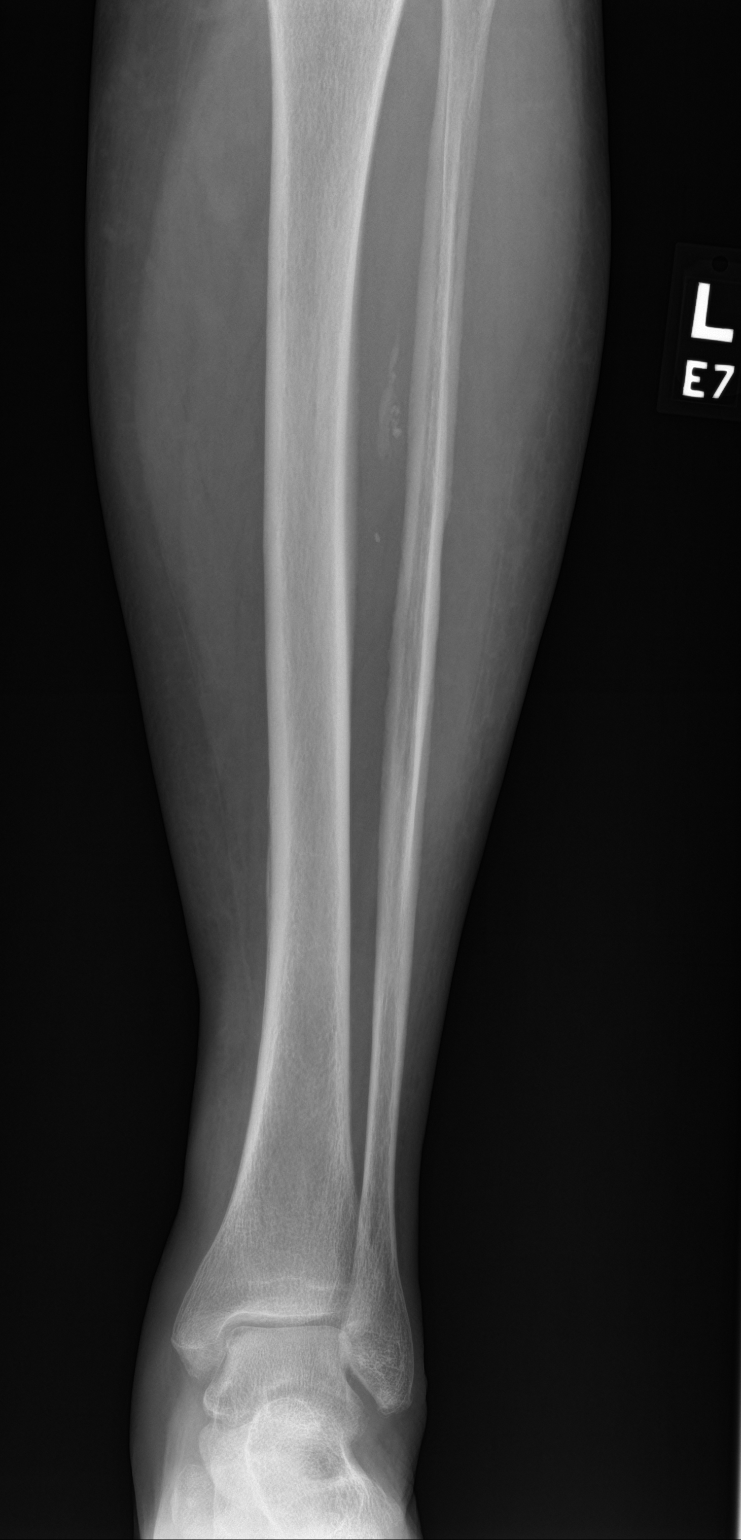
[im 3/4]
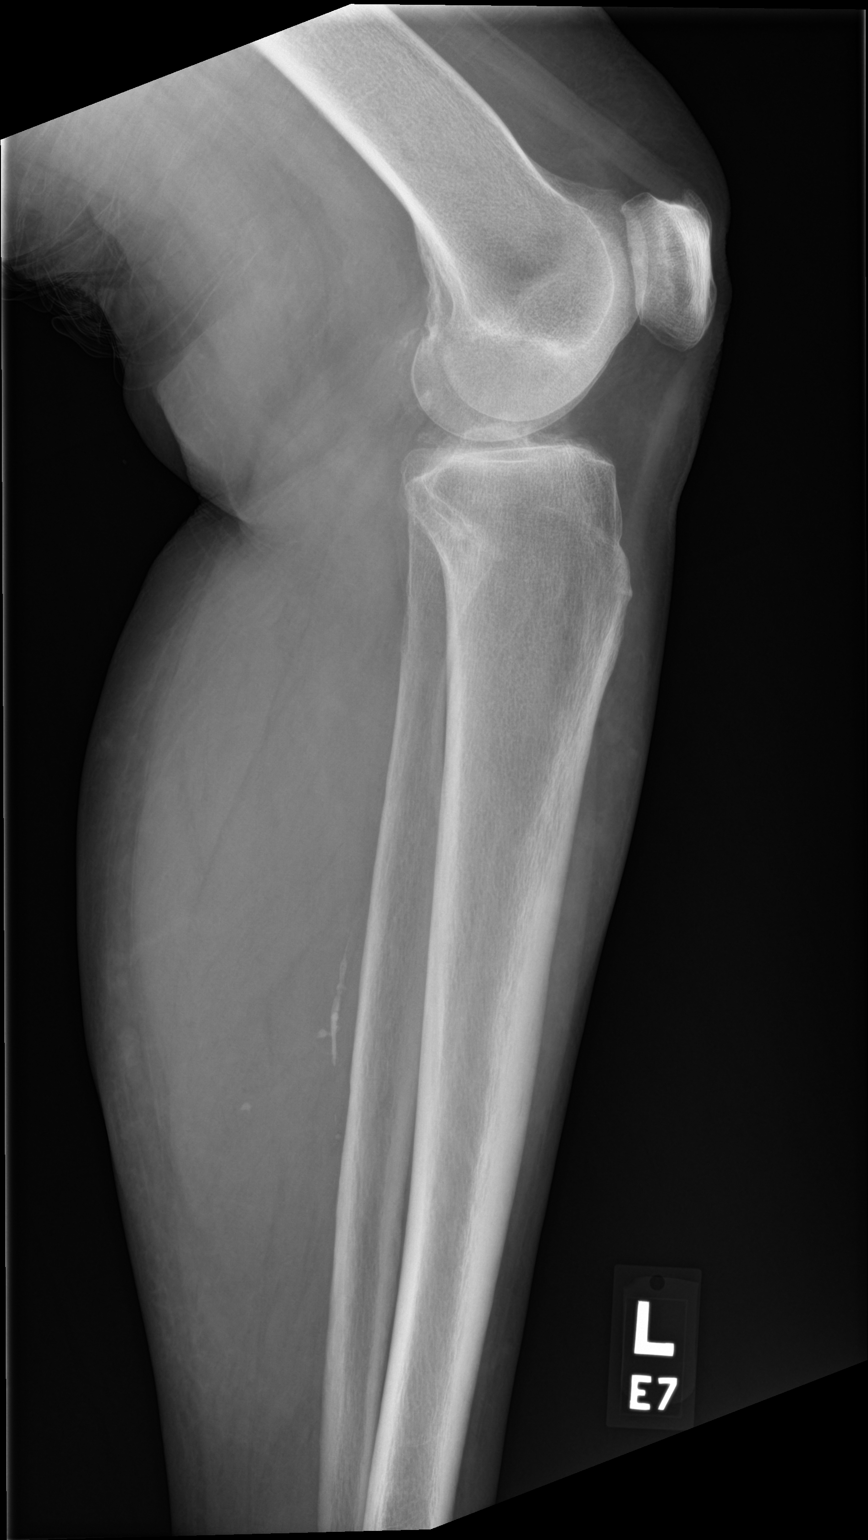
[im 4/4]
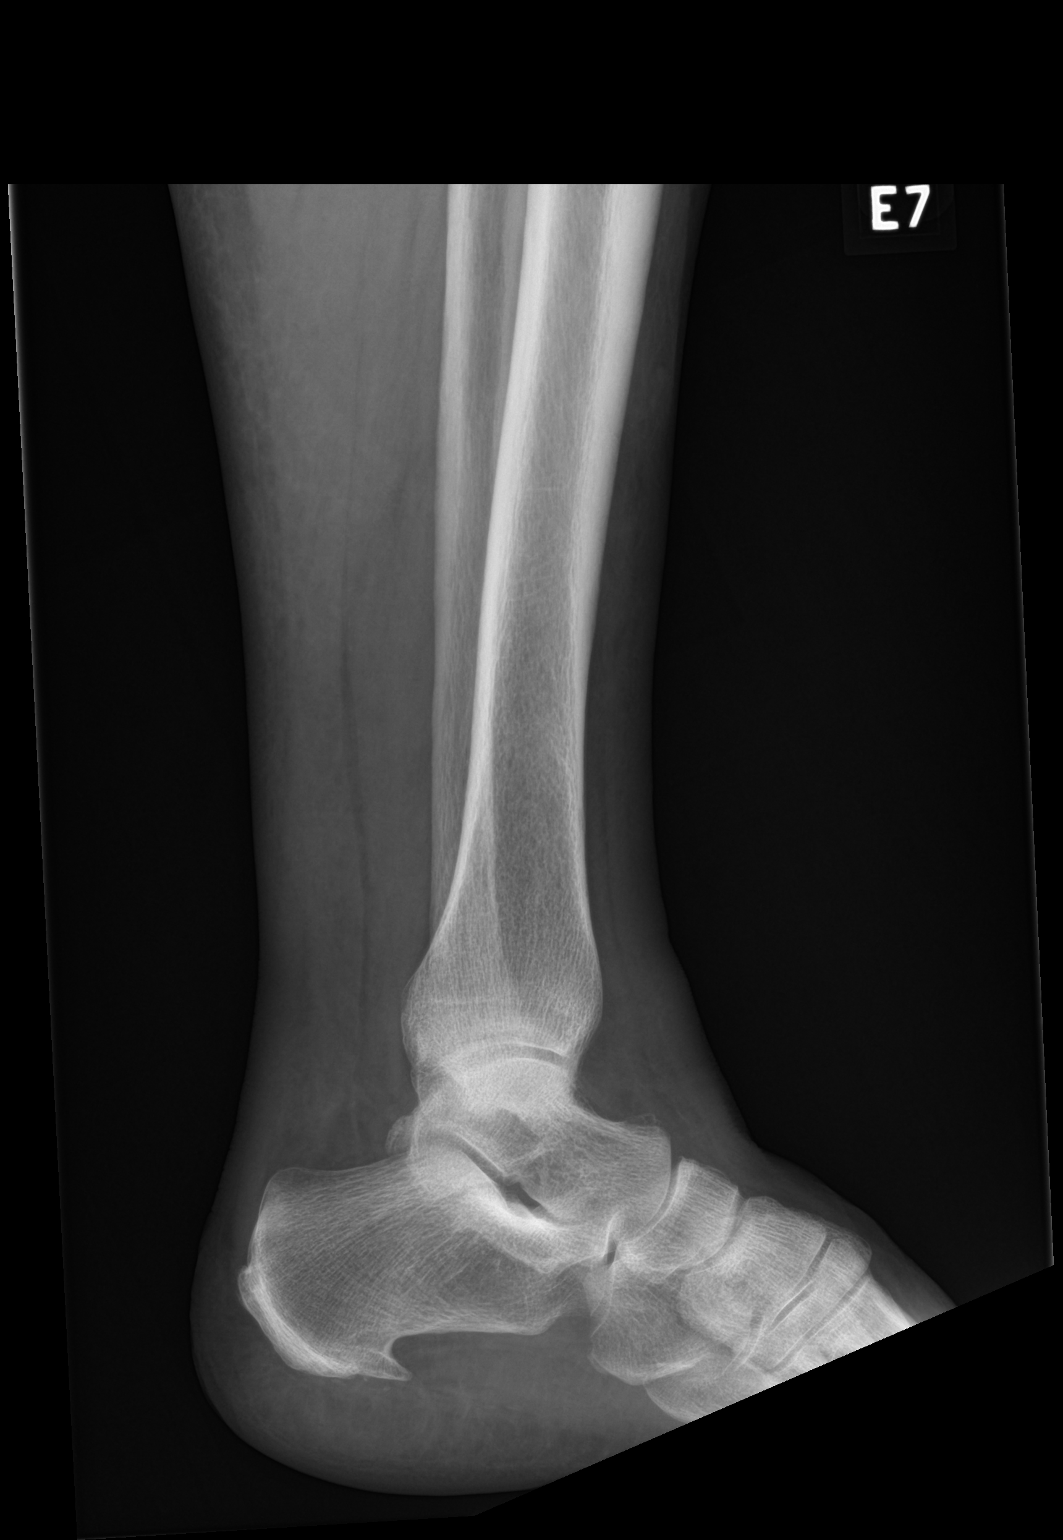

[4 of 4 positions shown; findings below may reference images not displayed]

FINDINGS: Mild degenerative changes of the knee joint are seen. No acute
fracture or dislocation is noted. Vascular calcifications are seen.
Calcaneal spurring is noted.
IMPRESSION: Mild degenerative change without acute abnormality.

## 2023-01-28 ENCOUNTER — Ambulatory Visit (INDEPENDENT_AMBULATORY_CARE_PROVIDER_SITE_OTHER): Payer: Medicare PPO

## 2023-01-28 ENCOUNTER — Other Ambulatory Visit: Payer: Self-pay

## 2023-01-28 VITALS — BP 118/66 | Ht 66.0 in | Wt 156.6 lb

## 2023-01-28 DIAGNOSIS — Z0000 Encounter for general adult medical examination without abnormal findings: Secondary | ICD-10-CM | POA: Diagnosis not present

## 2023-01-28 DIAGNOSIS — R42 Dizziness and giddiness: Secondary | ICD-10-CM

## 2023-01-28 MED ORDER — MECLIZINE HCL 25 MG PO TABS: 25.0000 mg | ORAL_TABLET | Freq: Three times a day (TID) | ORAL | 0 refills | Status: DC | PRN

## 2023-01-28 NOTE — Patient Instructions (Signed)
Tasha Wallace , Thank you for taking time to come for your Medicare Wellness Visit. I appreciate your ongoing commitment to your health goals. Please review the following plan we discussed and let me know if I can assist you in the future.   Referrals/Orders/Follow-Ups/Clinician Recommendations: none  This is a list of the screening recommended for you and due dates:  Health Maintenance  Topic Date Due   COVID-19 Vaccine (5 - 2023-24 season) 02/16/2022   Flu Shot  01/17/2023   Medicare Annual Wellness Visit  01/28/2024   DTaP/Tdap/Td vaccine (5 - Td or Tdap) 10/13/2025   Pneumonia Vaccine  Completed   DEXA scan (bone density measurement)  Completed   Zoster (Shingles) Vaccine  Completed   HPV Vaccine  Aged Out   Screening for Lung Cancer  Discontinued   Hepatitis C Screening  Discontinued    Advanced directives: (Copy Requested) Please bring a copy of your health care power of attorney and living will to the office to be added to your chart at your convenience.  Next Medicare Annual Wellness Visit scheduled for next year: Yes 01/29/2024 @ 2:30pm in person  Preventive Care 65 Years and Older, Female Preventive care refers to lifestyle choices and visits with your health care provider that can promote health and wellness. What does preventive care include? A yearly physical exam. This is also called an annual well check. Dental exams once or twice a year. Routine eye exams. Ask your health care provider how often you should have your eyes checked. Personal lifestyle choices, including: Daily care of your teeth and gums. Regular physical activity. Eating a healthy diet. Avoiding tobacco and drug use. Limiting alcohol use. Practicing safe sex. Taking low-dose aspirin every day. Taking vitamin and mineral supplements as recommended by your health care provider. What happens during an annual well check? The services and screenings done by your health care provider during your annual well  check will depend on your age, overall health, lifestyle risk factors, and family history of disease. Counseling  Your health care provider may ask you questions about your: Alcohol use. Tobacco use. Drug use. Emotional well-being. Home and relationship well-being. Sexual activity. Eating habits. History of falls. Memory and ability to understand (cognition). Work and work Astronomer. Reproductive health. Screening  You may have the following tests or measurements: Height, weight, and BMI. Blood pressure. Lipid and cholesterol levels. These may be checked every 5 years, or more frequently if you are over 46 years old. Skin check. Lung cancer screening. You may have this screening every year starting at age 36 if you have a 30-pack-year history of smoking and currently smoke or have quit within the past 15 years. Fecal occult blood test (FOBT) of the stool. You may have this test every year starting at age 32. Flexible sigmoidoscopy or colonoscopy. You may have a sigmoidoscopy every 5 years or a colonoscopy every 10 years starting at age 85. Hepatitis C blood test. Hepatitis B blood test. Sexually transmitted disease (STD) testing. Diabetes screening. This is done by checking your blood sugar (glucose) after you have not eaten for a while (fasting). You may have this done every 1-3 years. Bone density scan. This is done to screen for osteoporosis. You may have this done starting at age 82. Mammogram. This may be done every 1-2 years. Talk to your health care provider about how often you should have regular mammograms. Talk with your health care provider about your test results, treatment options, and if necessary, the  need for more tests. Vaccines  Your health care provider may recommend certain vaccines, such as: Influenza vaccine. This is recommended every year. Tetanus, diphtheria, and acellular pertussis (Tdap, Td) vaccine. You may need a Td booster every 10 years. Zoster  vaccine. You may need this after age 12. Pneumococcal 13-valent conjugate (PCV13) vaccine. One dose is recommended after age 24. Pneumococcal polysaccharide (PPSV23) vaccine. One dose is recommended after age 61. Talk to your health care provider about which screenings and vaccines you need and how often you need them. This information is not intended to replace advice given to you by your health care provider. Make sure you discuss any questions you have with your health care provider. Document Released: 07/01/2015 Document Revised: 02/22/2016 Document Reviewed: 04/05/2015 Elsevier Interactive Patient Education  2017 [employer].  Fall Prevention in the Home Falls can cause injuries. They can happen to people of all ages. There are many things you can do to make your home safe and to help prevent falls. What can I do on the outside of my home? Regularly fix the edges of walkways and driveways and fix any cracks. Remove anything that might make you trip as you walk through a door, such as a raised step or threshold. Trim any bushes or trees on the path to your home. Use bright outdoor lighting. Clear any walking paths of anything that might make someone trip, such as rocks or tools. Regularly check to see if handrails are loose or broken. Make sure that both sides of any steps have handrails. Any raised decks and porches should have guardrails on the edges. Have any leaves, snow, or ice cleared regularly. Use sand or salt on walking paths during winter. Clean up any spills in your garage right away. This includes oil or grease spills. What can I do in the bathroom? Use night lights. Install grab bars by the toilet and in the tub and shower. Do not use towel bars as grab bars. Use non-skid mats or decals in the tub or shower. If you need to sit down in the shower, use a plastic, non-slip stool. Keep the floor dry. Clean up any water that spills on the floor as soon as it happens. Remove  soap buildup in the tub or shower regularly. Attach bath mats securely with double-sided non-slip rug tape. Do not have throw rugs and other things on the floor that can make you trip. What can I do in the bedroom? Use night lights. Make sure that you have a light by your bed that is easy to reach. Do not use any sheets or blankets that are too big for your bed. They should not hang down onto the floor. Have a firm chair that has side arms. You can use this for support while you get dressed. Do not have throw rugs and other things on the floor that can make you trip. What can I do in the kitchen? Clean up any spills right away. Avoid walking on wet floors. Keep items that you use a lot in easy-to-reach places. If you need to reach something above you, use a strong step stool that has a grab bar. Keep electrical cords out of the way. Do not use floor polish or wax that makes floors slippery. If you must use wax, use non-skid floor wax. Do not have throw rugs and other things on the floor that can make you trip. What can I do with my stairs? Do not leave any items on the  stairs. Make sure that there are handrails on both sides of the stairs and use them. Fix handrails that are broken or loose. Make sure that handrails are as long as the stairways. Check any carpeting to make sure that it is firmly attached to the stairs. Fix any carpet that is loose or worn. Avoid having throw rugs at the top or bottom of the stairs. If you do have throw rugs, attach them to the floor with carpet tape. Make sure that you have a light switch at the top of the stairs and the bottom of the stairs. If you do not have them, ask someone to add them for you. What else can I do to help prevent falls? Wear shoes that: Do not have high heels. Have rubber bottoms. Are comfortable and fit you well. Are closed at the toe. Do not wear sandals. If you use a stepladder: Make sure that it is fully opened. Do not climb a  closed stepladder. Make sure that both sides of the stepladder are locked into place. Ask someone to hold it for you, if possible. Clearly mark and make sure that you can see: Any grab bars or handrails. First and last steps. Where the edge of each step is. Use tools that help you move around (mobility aids) if they are needed. These include: Canes. Walkers. Scooters. Crutches. Turn on the lights when you go into a dark area. Replace any light bulbs as soon as they burn out. Set up your furniture so you have a clear path. Avoid moving your furniture around. If any of your floors are uneven, fix them. If there are any pets around you, be aware of where they are. Review your medicines with your doctor. Some medicines can make you feel dizzy. This can increase your chance of falling. Ask your doctor what other things that you can do to help prevent falls. This information is not intended to replace advice given to you by your health care provider. Make sure you discuss any questions you have with your health care provider. Document Released: 03/31/2009 Document Revised: 11/10/2015 Document Reviewed: 07/09/2014 Elsevier Interactive Patient Education  2017 [employer].

## 2023-01-28 NOTE — Progress Notes (Signed)
Subjective:   Tasha Wallace is a 82 y.o. female who presents for Medicare Annual (Subsequent) preventive examination.  Visit Complete: In person  Patient Medicare AWV questionnaire was completed by the patient on (not done) ; I have confirmed that all information answered by patient is correct and no changes since this date.  Review of Systems    Cardiac Risk Factors include: advanced age (>58men, >33 women);sedentary lifestyle    Objective:    Today's Vitals   01/28/23 1359  Weight: 156 lb 9.6 oz (71 kg)  Height: 5\' 6"  (1.676 m)   Body mass index is 25.28 kg/m.     01/28/2023    2:18 PM 02/13/2022    9:30 AM 01/16/2022   10:02 AM 02/04/2018   11:10 AM 08/10/2016    1:17 PM 10/14/2015    4:26 PM 06/15/2015   11:08 AM  Advanced Directives  Does Patient Have a Medical Advance Directive? Yes No No Yes Yes No Yes  Type of Estate agent of [place];Living will   Healthcare Power of Attorney Living will;Healthcare Power of Attorney  Living will;Healthcare Power of Attorney  Does patient want to make changes to medical advance directive?    No - Patient declined     Copy of Healthcare Power of Attorney in Chart?    Yes No - copy requested    Would patient like information on creating a medical advance directive?   No - Patient declined   No - patient declined information     Current Medications (verified) Outpatient Encounter Medications as of 01/28/2023  Medication Sig   Brimonidine Tartrate (LUMIFY OP) Place 1 drop into both eyes daily as needed (redness).    meclizine (ANTIVERT) 25 MG tablet Take 1 tablet (25 mg total) by mouth 3 (three) times daily as needed for dizziness.   Omeprazole Magnesium (PRILOSEC PO)  (Patient not taking: Reported on 01/16/2022)   No facility-administered encounter medications on file as of 01/28/2023.    Allergies (verified) Metronidazole   History: Past Medical History:  Diagnosis Date   Anxiety    GERD (gastroesophageal  reflux disease)    Hyperlipidemia    Past Surgical History:  Procedure Laterality Date   CATARACT EXTRACTION W/PHACO Right 12/31/2017   Procedure: CATARACT EXTRACTION PHACO AND INTRAOCULAR LENS PLACEMENT (IOC);  Surgeon: Galen Manila, MD;  Location: [facility] ORS;  Service: Ophthalmology;  Laterality: Right;  [place] 00:43.2 AP% 16.0 CDE 6.91 Fluid pack lot # [id number]   CATARACT EXTRACTION W/PHACO Left 02/04/2018   Procedure: CATARACT EXTRACTION PHACO AND INTRAOCULAR LENS PLACEMENT (IOC);  Surgeon: Galen Manila, MD;  Location: [facility] ORS;  Service: Ophthalmology;  Laterality: Left;  [place] 00:46.6 AP% 17.6 CDE 8.19 Fluid Pack lot # [id number]   colonoscopy     GANGLION CYST EXCISION  1986   IRRIGATION AND DEBRIDEMENT SEBACEOUS CYST  1990   TONSILLECTOMY AND ADENOIDECTOMY  1950   URETHRA SURGERY  1971   Family History  Problem Relation Age of Onset   Hypertension Mother    CVA Mother    Heart disease Father    Social History   Socioeconomic History   Marital status: Divorced    Spouse name: Not on file   Number of children: Not on file   Years of education: Not on file   Highest education level: Not on file  Occupational History   Not on file  Tobacco Use   Smoking status: Former    Current packs/day: 0.00  Average packs/day: 1 pack/day for 35.0 years (35.0 ttl pk-yrs)    Types: Cigarettes    Start date: 06/17/1977    Quit date: 06/17/2012    Years since quitting: 10.6   Smokeless tobacco: Never  Vaping Use   Vaping status: Never Used  Substance and Sexual Activity   Alcohol use: Yes    Alcohol/week: 2.0 standard drinks of alcohol    Types: 2 Shots of liquor per week   Drug use: No   Sexual activity: Not on file  Other Topics Concern   Not on file  Social History Narrative   Not on file   Social Determinants of Health   Financial Resource Strain: Low Risk  (01/28/2023)   Overall Financial Resource Strain (CARDIA)    Difficulty of Paying Living Expenses: Not hard  at all  Food Insecurity: No Food Insecurity (01/28/2023)   Hunger Vital Sign    Worried About Running Out of Food in the Last Year: Never true    Ran Out of Food in the Last Year: Never true  Transportation Needs: No Transportation Needs (01/28/2023)   PRAPARE - Administrator, Civil Service (Medical): No    Lack of Transportation (Non-Medical): No  Physical Activity: Inactive (01/28/2023)   Exercise Vital Sign    Days of Exercise per Week: 0 days    Minutes of Exercise per Session: 0 min  Stress: No Stress Concern Present (01/28/2023)   [employer] of Occupational Health - Occupational Stress Questionnaire    Feeling of Stress : Not at all  Social Connections: Moderately Isolated (01/28/2023)   Social Connection and Isolation Panel [NHANES]    Frequency of Communication with Friends and Family: More than three times a week    Frequency of Social Gatherings with Friends and Family: Three times a week    Attends Religious Services: More than 4 times per year    Active Member of Clubs or Organizations: No    Attends Banker Meetings: Never    Marital Status: Divorced    Tobacco Counseling Counseling given: Not Answered   Clinical Intake:  Pre-visit preparation completed: Yes  Pain : No/denies pain     BMI - recorded: 25.28 Nutritional Status: BMI 25 -29 Overweight Nutritional Risks: None Diabetes: No  How often do you need to have someone help you when you read instructions, pamphlets, or other written materials from your doctor or pharmacy?: 1 - Never  Interpreter Needed?: No  Comments: lives alone Information entered by :: B.Jazaria Jarecki,LPN   Activities of Daily Living    01/28/2023    2:18 PM  In your present state of health, do you have any difficulty performing the following activities:  Hearing? 0  Vision? 0  Difficulty concentrating or making decisions? 0  Walking or climbing stairs? 0  Dressing or bathing? 0  Doing errands,  shopping? 0  Preparing Food and eating ? N  Using the Toilet? N  In the past six months, have you accidently leaked urine? N  Do you have problems with loss of bowel control? N  Managing your Medications? N  Managing your Finances? N  Housekeeping or managing your Housekeeping? N    Patient Care Team: Erasmo Downer, MD as PCP - General (Family Medicine)  Indicate any recent Medical Services you may have received from other than [facility] providers in the past year (date may be approximate).     Assessment:   This is a routine wellness examination for [place].  Hearing/Vision screen Hearing Screening - Comments:: Adequate hearing Vision Screening - Comments:: Adequate vision: [facility]  Dietary issues and exercise activities discussed:     Goals Addressed               This Visit's Progress     DIET - EAT MORE FRUITS AND VEGETABLES   On track     Exercise 150 minutes per week (moderate activity) (pt-stated)   Not on track     08/10/16- Pt has succeeded in completing last years goal of exercising 150 minutes a week.        Depression Screen    01/28/2023    2:16 PM 01/16/2022   10:00 AM 09/18/2021    1:08 PM 07/17/2021    2:17 PM 05/20/2020   10:26 AM 05/07/2019    2:19 PM 08/12/2017    2:02 PM  PHQ 2/9 Scores  PHQ - 2 Score 0 0 0 0 0 0 0  PHQ- 9 Score  0 0 0   0    Fall Risk    01/28/2023    2:08 PM 01/16/2022   10:03 AM 09/18/2021    1:09 PM 07/17/2021    2:17 PM 05/20/2020   10:14 AM  Fall Risk   Falls in the past year? 0 0 0 0 0  Number falls in past yr: 0 0 0 0 0  Injury with Fall? 0 0 0 0 0  Risk for fall due to : No Fall Risks No Fall Risks  No Fall Risks No Fall Risks  Follow up Education provided;Falls prevention discussed Falls evaluation completed Falls evaluation completed  Falls evaluation completed    MEDICARE RISK AT HOME:  Medicare Risk at Home - 01/28/23 1410     Any stairs in or around the home? No    If so, are there any without handrails?  No    Home free of loose throw rugs in walkways, pet beds, electrical cords, etc? Yes    Adequate lighting in your home to reduce risk of falls? Yes    Life alert? No    Use of a cane, walker or w/c? No    Grab bars in the bathroom? Yes    Shower chair or bench in shower? Yes    Elevated toilet seat or a handicapped toilet? Yes             TIMED UP AND GO:  Was the test performed?  Yes  Length of time to ambulate 10 feet: 8 sec Gait steady and fast without use of assistive device    Cognitive Function:        01/28/2023    2:21 PM 08/10/2016    1:24 PM  6CIT Screen  What Year? 0 points 0 points  What month? 0 points 0 points  What time? 0 points 0 points  Count back from 20 0 points 0 points  Months in reverse 0 points 0 points  Repeat phrase 0 points 4 points  Total Score 0 points 4 points    Immunizations Immunization History  Administered Date(s) Administered   Influenza, High Dose Seasonal PF 04/06/2019   Influenza,inj,quad, With Preservative 02/17/2018   Influenza-Unspecified 04/06/2019, 02/16/2021   PFIZER(Purple Top)SARS-COV-2 Vaccination 06/22/2019, 07/13/2019   Pfizer Covid-19 Vaccine Bivalent Booster 46yrs & up 04/11/2020, 10/24/2020   Pneumococcal Conjugate PCV 7 03/26/2018   Pneumococcal Conjugate-13 07/31/2013, 03/19/2017   Pneumococcal Polysaccharide-23 07/08/2009, 10/24/2020   Td 05/18/2004, 07/31/2013   Tdap 07/31/2013, 10/14/2015  Zoster Recombinant(Shingrix) 02/05/2018, 04/09/2018   Zoster, Live 06/27/2007, 01/13/2009    TDAP status: Up to date  Flu Vaccine status: Up to date  Pneumococcal vaccine status: Up to date  Covid-19 vaccine status: Completed vaccines  Qualifies for Shingles Vaccine? Yes   Zostavax completed Yes   Shingrix Completed?: Yes  Screening Tests Health Maintenance  Topic Date Due   COVID-19 Vaccine (5 - 2023-24 season) 02/16/2022   INFLUENZA VACCINE  01/17/2023   Medicare Annual Wellness (AWV)  01/28/2024    DTaP/Tdap/Td (5 - Td or Tdap) 10/13/2025   Pneumonia Vaccine 19+ Years old  Completed   DEXA SCAN  Completed   Zoster Vaccines- Shingrix  Completed   HPV VACCINES  Aged Out   Lung Cancer Screening  Discontinued   Hepatitis C Screening  Discontinued    Health Maintenance  Health Maintenance Due  Topic Date Due   COVID-19 Vaccine (5 - 2023-24 season) 02/16/2022   INFLUENZA VACCINE  01/17/2023    Colorectal cancer screening: No longer required.   Mammogram status: No longer required due to age.  Bone Density status: Completed yes. Results reflect: Bone density results: NORMAL. Repeat every 5 years.  Lung Cancer Screening: (Low Dose CT Chest recommended if Age 54-80 years, 20 pack-year currently smoking OR have quit w/in 15years.) does not qualify.   Lung Cancer Screening Referral: no  Additional Screening:  Hepatitis C Screening: does not qualify; Completed yes  Vision Screening: Recommended annual ophthalmology exams for early detection of glaucoma and other disorders of the eye. Is the patient up to date with their annual eye exam?  Yes  Who is the provider or what is the name of the office in which the patient attends annual eye exams? [facility] If pt is not established with a provider, would they like to be referred to a provider to establish care? No .   Dental Screening: Recommended annual dental exams for proper oral hygiene  Diabetic Foot Exam: n/a  Community Resource Referral / Chronic Care Management: CRR required this visit?  No   CCM required this visit?  Appt scheduled with PCP     Plan:     I have personally reviewed and noted the following in the patient's chart:   Medical and social history Use of alcohol, tobacco or illicit drugs  Current medications and supplements including opioid prescriptions. Patient is not currently taking opioid prescriptions. Functional ability and status Nutritional status Physical activity Advanced directives List  of other physicians Hospitalizations, surgeries, and ER visits in previous 12 months Vitals Screenings to include cognitive, depression, and falls Referrals and appointments  In addition, I have reviewed and discussed with patient certain preventive protocols, quality metrics, and best practice recommendations. A written personalized care plan for preventive services as well as general preventive health recommendations were provided to patient.     Sue Lush, LPN   1/61/0960   After Visit Summary: (MyChart) Due to this being a telephonic visit, the after visit summary with patients personalized plan was offered to patient via MyChart   Nurse Notes: The patient states she is doing well and has no concerns or questions at this time.

## 2023-02-14 ENCOUNTER — Encounter: Payer: Self-pay | Admitting: Family Medicine

## 2023-02-14 ENCOUNTER — Ambulatory Visit (INDEPENDENT_AMBULATORY_CARE_PROVIDER_SITE_OTHER): Payer: Medicare PPO | Admitting: Family Medicine

## 2023-02-14 VITALS — BP 138/66 | HR 73 | Ht 66.0 in | Wt 154.7 lb

## 2023-02-14 DIAGNOSIS — I700 Atherosclerosis of aorta: Secondary | ICD-10-CM

## 2023-02-14 DIAGNOSIS — I8393 Asymptomatic varicose veins of bilateral lower extremities: Secondary | ICD-10-CM

## 2023-02-14 DIAGNOSIS — Z23 Encounter for immunization: Secondary | ICD-10-CM

## 2023-02-14 DIAGNOSIS — R200 Anesthesia of skin: Secondary | ICD-10-CM

## 2023-02-14 DIAGNOSIS — Z0001 Encounter for general adult medical examination with abnormal findings: Secondary | ICD-10-CM

## 2023-02-14 DIAGNOSIS — E7800 Pure hypercholesterolemia, unspecified: Secondary | ICD-10-CM

## 2023-02-14 DIAGNOSIS — R252 Cramp and spasm: Secondary | ICD-10-CM

## 2023-02-14 DIAGNOSIS — J449 Chronic obstructive pulmonary disease, unspecified: Secondary | ICD-10-CM | POA: Diagnosis not present

## 2023-02-14 DIAGNOSIS — Z0000 Encounter for general adult medical examination without abnormal findings: Secondary | ICD-10-CM

## 2023-02-14 NOTE — Progress Notes (Signed)
Complete physical exam  Patient: Tasha Wallace   DOB: 1941-05-29   82 y.o. Female  MRN: [id number]  Subjective:    Chief Complaint  Patient presents with   Annual Exam    Tasha STAUDE is a 82 y.o. female who presents today for a complete physical exam. She reports consuming a general diet.  She generally feels well.  She does have additional problems to discuss today.  Discussed the use of AI scribe software for clinical note transcription with the patient, who gave verbal consent to proceed.  History of Present Illness   The patient, an 82 year old with a history of vertigo and varicose veins, presents for an annual physical. She expresses concerns about changes in her health, including a recent insect bite on her leg that caused significant inflammation and swelling, and numbness on the outside of her right leg. The numbness has been present for a while and is not associated with pain or trauma. She also reports occasional finger cramps and nocturnal leg cramps that do not wake her up.  The patient has a history of vertigo, which she describes as brief episodes of feeling like she is spinning. She recently experienced two such episodes, but neither developed into full vertigo. She also reports a sensation of a hammer hitting her head, which lasts for a few seconds and has been occurring since she developed a hematoma last year.  The patient is concerned about her varicose veins, which she feels have become more prominent recently. She is aware of the benefits of compression socks but finds them difficult to wear. She also expresses concerns about her cholesterol levels, as her father died of heart disease at a young age.         Most recent fall risk assessment:    01/28/2023    2:08 PM  Fall Risk   Falls in the past year? 0  Number falls in past yr: 0  Injury with Fall? 0  Risk for fall due to : No Fall Risks  Follow up Education provided;Falls prevention discussed     Most  recent depression screenings:    01/28/2023    2:16 PM 01/16/2022   10:00 AM  PHQ 2/9 Scores  PHQ - 2 Score 0 0  PHQ- 9 Score  0        Patient Care Team: Erasmo Downer, MD as PCP - General (Family Medicine)   Outpatient Medications Prior to Visit  Medication Sig   [DISCONTINUED] Brimonidine Tartrate (LUMIFY OP) Place 1 drop into both eyes daily as needed (redness).  (Patient not taking: Reported on 02/14/2023)   [DISCONTINUED] meclizine (ANTIVERT) 25 MG tablet Take 1 tablet (25 mg total) by mouth 3 (three) times daily as needed for dizziness. (Patient not taking: Reported on 02/14/2023)   [DISCONTINUED] Omeprazole Magnesium (PRILOSEC PO)  (Patient not taking: Reported on 01/16/2022)   No facility-administered medications prior to visit.    ROS per HPI     Objective:     BP 138/66 (BP Location: Left Arm, Patient Position: Sitting, Cuff Size: Normal)   Pulse 73   Ht 5\' 6"  (1.676 m)   Wt 154 lb 11.2 oz (70.2 kg)   SpO2 99%   BMI 24.97 kg/m    Physical Exam Vitals reviewed.  Constitutional:      General: She is not in acute distress.    Appearance: Normal appearance. She is well-developed. She is not diaphoretic.  HENT:     Head: Normocephalic  and atraumatic.     Right Ear: Tympanic membrane, ear canal and external ear normal.     Left Ear: Tympanic membrane, ear canal and external ear normal.     Nose: Nose normal.     Mouth/Throat:     Mouth: Mucous membranes are moist.     Pharynx: Oropharynx is clear. No oropharyngeal exudate.  Eyes:     General: No scleral icterus.    Conjunctiva/sclera: Conjunctivae normal.     Pupils: Pupils are equal, round, and reactive to light.  Neck:     Thyroid: No thyromegaly.  Cardiovascular:     Rate and Rhythm: Normal rate and regular rhythm.     Heart sounds: Normal heart sounds. No murmur heard. Pulmonary:     Effort: Pulmonary effort is normal. No respiratory distress.     Breath sounds: Normal breath sounds. No  wheezing or rales.  Abdominal:     General: There is no distension.     Palpations: Abdomen is soft.     Tenderness: There is no abdominal tenderness.  Musculoskeletal:        General: No deformity.     Cervical back: Neck supple.     Right lower leg: No edema.     Left lower leg: No edema.  Lymphadenopathy:     Cervical: No cervical adenopathy.  Skin:    General: Skin is warm and dry.     Findings: No rash.  Neurological:     Mental Status: She is alert and oriented to person, place, and time. Mental status is at baseline.     Gait: Gait normal.  Psychiatric:        Mood and Affect: Mood normal.        Behavior: Behavior normal.        Thought Content: Thought content normal.      No results found for any visits on 02/14/23.     Assessment & Plan:    Routine Health Maintenance and Physical Exam  Immunization History  Administered Date(s) Administered   Fluad Trivalent(High Dose 65+) 02/14/2023   Influenza, High Dose Seasonal PF 04/06/2019   Influenza,inj,quad, With Preservative 02/17/2018   Influenza-Unspecified 04/06/2019, 02/16/2021   PFIZER(Purple Top)SARS-COV-2 Vaccination 06/22/2019, 07/13/2019   Pfizer Covid-19 Vaccine Bivalent Booster 54yrs & up 04/11/2020, 10/24/2020   Pneumococcal Conjugate PCV 7 03/26/2018   Pneumococcal Conjugate-13 07/31/2013, 03/19/2017   Pneumococcal Polysaccharide-23 07/08/2009, 10/24/2020   Td 05/18/2004, 07/31/2013   Tdap 07/31/2013, 10/14/2015   Zoster Recombinant(Shingrix) 02/05/2018, 04/09/2018   Zoster, Live 06/27/2007, 01/13/2009    Health Maintenance  Topic Date Due   COVID-19 Vaccine (5 - 2023-24 season) 02/16/2022   Medicare Annual Wellness (AWV)  01/28/2024   DTaP/Tdap/Td (5 - Td or Tdap) 10/13/2025   Pneumonia Vaccine 22+ Years old  Completed   INFLUENZA VACCINE  Completed   DEXA SCAN  Completed   Zoster Vaccines- Shingrix  Completed   HPV VACCINES  Aged Out   Lung Cancer Screening  Discontinued   Hepatitis C  Screening  Discontinued    Discussed health benefits of physical activity, and encouraged her to engage in regular exercise appropriate for her age and condition.  Problem List Items Addressed This Visit       Cardiovascular and Mediastinum   Aortic atherosclerosis (HCC)   Relevant Orders   Comprehensive metabolic panel   Lipid panel     Respiratory   COPD (chronic obstructive pulmonary disease) (HCC)     Other   Hypercholesteremia  Reviewed last lipid panel Not currently on a statin Recheck FLP and CMP Discussed diet and exercise       Relevant Orders   Comprehensive metabolic panel   Lipid panel   Other Visit Diagnoses     Encounter for annual physical exam    -  Primary   Relevant Orders   Comprehensive metabolic panel   Lipid panel   Flu vaccine need       Relevant Orders   Flu Vaccine Trivalent High Dose (Fluad) (Completed)   Asymptomatic varicose veins of both lower extremities       Cramping of hands       Right leg numbness               Insect Bite Recent insect bite on the leg with localized inflammation and swelling. No signs of spreading infection or systemic symptoms. -Continue with topical Benadryl and oral Benadryl as needed. -Observe for signs of spreading redness or worsening symptoms.  Numbness in Right Leg Chronic numbness on the lateral aspect of the right leg. No associated pain or functional impairment. No recent trauma or injury. -Advised to avoid tight clothing that may compress the nerve. -Plan for watchful waiting given the absence of pain or functional impairment.  Muscle Cramps Reports of hand and nocturnal leg cramps. No associated weakness or functional impairment. -Order electrolyte panel to rule out electrolyte imbalances. -If electrolytes are normal, consider trial of over-the-counter magnesium supplement at bedtime.  Varicose Veins Noted varicose veins in the legs. No associated pain or functional impairment. -Advised  to wear compression socks, especially during long flights.  General Health Maintenance / Followup Plans -Perform routine labs today including kidney and liver function, blood sugar, and cholesterol. -Schedule next year's physical. -Advised to get updated COVID-19 booster vaccine when available in mid-September. -Will notify patient with lab results and any necessary changes to management plan.        Return in about 1 year (around 02/14/2024) for CPE.     Shirlee Latch, MD

## 2023-02-14 NOTE — Assessment & Plan Note (Signed)
Reviewed last lipid panel Not currently on a statin Recheck FLP and CMP Discussed diet and exercise  

## 2023-02-15 LAB — COMPREHENSIVE METABOLIC PANEL
ALT: 19 IU/L (ref 0–32)
AST: 21 IU/L (ref 0–40)
Albumin: 4.3 g/dL (ref 3.7–4.7)
Alkaline Phosphatase: 88 IU/L (ref 44–121)
BUN/Creatinine Ratio: 20 (ref 12–28)
BUN: 18 mg/dL (ref 8–27)
Bilirubin Total: 0.3 mg/dL (ref 0.0–1.2)
CO2: 24 mmol/L (ref 20–29)
Calcium: 10 mg/dL (ref 8.7–10.3)
Chloride: 104 mmol/L (ref 96–106)
Creatinine, Ser: 0.88 mg/dL (ref 0.57–1.00)
Globulin, Total: 2.7 g/dL (ref 1.5–4.5)
Glucose: 86 mg/dL (ref 70–99)
Potassium: 4.6 mmol/L (ref 3.5–5.2)
Sodium: 140 mmol/L (ref 134–144)
Total Protein: 7 g/dL (ref 6.0–8.5)
eGFR: 66 mL/min/{1.73_m2} (ref >59)

## 2023-02-15 LAB — LIPID PANEL
Chol/HDL Ratio: 3.6 ratio (ref 0.0–4.4)
Cholesterol, Total: 270 mg/dL — ABNORMAL HIGH (ref 100–199)
HDL: 75 mg/dL (ref >39)
LDL Chol Calc (NIH): 176 mg/dL — ABNORMAL HIGH (ref 0–99)
Triglycerides: 110 mg/dL (ref 0–149)
VLDL Cholesterol Cal: 19 mg/dL (ref 5–40)

## 2023-12-03 ENCOUNTER — Emergency Department
Admission: EM | Admit: 2023-12-03 | Discharge: 2023-12-03 | Disposition: A | Discharge status: Home / Self Care | Attending: Emergency Medicine | Admitting: Emergency Medicine

## 2023-12-03 ENCOUNTER — Emergency Department

## 2023-12-03 ENCOUNTER — Other Ambulatory Visit: Payer: Self-pay

## 2023-12-03 DIAGNOSIS — R2242 Localized swelling, mass and lump, left lower limb: Secondary | ICD-10-CM | POA: Diagnosis present

## 2023-12-03 DIAGNOSIS — M7989 Other specified soft tissue disorders: Secondary | ICD-10-CM | POA: Insufficient documentation

## 2023-12-03 NOTE — ED Triage Notes (Signed)
 Pt to ED via POV from home. Pt reports left leg swelling x1 wk. Pt denies CP or SOB. No hx of DVT and not on thinners.

## 2023-12-03 NOTE — ED Notes (Signed)
 See triage note  Presents with some swelling to left lower leg  States she is tender to posterior calf No redness or injury  Ambulates well

## 2023-12-03 NOTE — ED Provider Notes (Signed)
 [facility] Provider Note    Event Date/Time   First MD Initiated Contact with Patient 12/03/23 1403     (approximate)   History   Leg Swelling   HPI  MERIS REEDE is a 83 y.o. female with PMH of anxiety, hyperlipidemia and GERD who presents for evaluation of left lower leg swelling for the past 5 days.  Patient states that it has been swelling up and down.  She denies chest pain and shortness of breath.  No history of blood clots and does not take a blood thinner.  Not having any pain and just noticed the swelling. Patient was seen at [facility] who recommended she come to the ED.      Physical Exam   Triage Vital Signs: ED Triage Vitals [12/03/23 1324]  Encounter Vitals Group     BP (!) 181/73     Girls Systolic BP Percentile      Girls Diastolic BP Percentile      Boys Systolic BP Percentile      Boys Diastolic BP Percentile      Pulse Rate 78     Resp 20     Temp 98.1 F (36.7 C)     Temp Source Oral     SpO2 99 %     Weight 159 lb (72.1 kg)     Height 5' 6 (1.676 m)     Head Circumference      Peak Flow      Pain Score 2     Pain Loc      Pain Education      Exclude from Growth Chart     Most recent vital signs: Vitals:   12/03/23 1324  BP: (!) 181/73  Pulse: 78  Resp: 20  Temp: 98.1 F (36.7 C)  SpO2: 99%   General: Awake, no distress.  CV:  Good peripheral perfusion. Resp:  Normal effort.  Abd:  No distention.  Other:  Left lower leg is swollen when compared with the right, no overlying skin changes, bruising or erythema.  Pitting edema up to just below the knee. Small abrasion on the lateral ankle but no surrounding erythema or warmth.  No pain with compression of the calf or passive dorsiflexion.   ED Results / Procedures / Treatments   Labs (all labs ordered are listed, but only abnormal results are displayed) Labs Reviewed - No data to display   RADIOLOGY  Left lower leg venous ultrasound obtained, interpreted the  images as well as reviewed the radiologist report which was negative for DVT.   PROCEDURES:  Critical Care performed: No  Procedures   MEDICATIONS ORDERED IN ED: Medications - No data to display   IMPRESSION / MDM / ASSESSMENT AND PLAN / ED COURSE  I reviewed the triage vital signs and the nursing notes.                             83 year old female presents for evaluation of unilateral lower leg swelling for 5 days.  Blood pressure is elevated otherwise vital signs are stable.  Differential diagnosis includes, but is not limited to, DVT, venous stasis, lymphedema, gravitational, low suspicion for cellulitis.  Patient's presentation is most consistent with acute complicated illness / injury requiring diagnostic workup.  Patient's blood pressure was elevated today but she denies any associated symptoms like chest pain, shortness of breath, headache and blurry vision.  Believe this is secondary  to being told she may have a blood clot.  Patient has never had problems with her blood pressure in the past.  We discussed checking her BP at home and following up with primary care.  Left leg venous ultrasound was negative for DVT.  Given negative imaging feel patient would be stable for outpatient management.  Recommended she use compression stockings.  Advised follow-up with PCP.  We discussed signs and symptoms to watch out for like development of chest pain and shortness of breath.  She voiced understanding, all questions were answered and she was stable at discharge.      FINAL CLINICAL IMPRESSION(S) / ED DIAGNOSES   Final diagnoses:  Left leg swelling     Rx / DC Orders   ED Discharge Orders     None        Note:  This document was prepared using Dragon voice recognition software and may include unintentional dictation errors.   Phyliss Breen, PA-C 12/03/23 1517    Kandee Orion, MD 12/04/23 [phone]

## 2023-12-03 NOTE — Discharge Instructions (Signed)
 Your ultrasound today was negative for DVT. You can try wearing a compression stocking to help with the swelling. Follow up with your PCP as needed.

## 2024-01-15 ENCOUNTER — Encounter (INDEPENDENT_AMBULATORY_CARE_PROVIDER_SITE_OTHER): Payer: Self-pay

## 2024-01-15 ENCOUNTER — Encounter (INDEPENDENT_AMBULATORY_CARE_PROVIDER_SITE_OTHER): Payer: Self-pay | Admitting: Nurse Practitioner

## 2024-02-05 ENCOUNTER — Encounter (INDEPENDENT_AMBULATORY_CARE_PROVIDER_SITE_OTHER): Payer: Self-pay

## 2024-02-05 ENCOUNTER — Encounter (INDEPENDENT_AMBULATORY_CARE_PROVIDER_SITE_OTHER): Payer: Self-pay | Admitting: Nurse Practitioner

## 2024-02-06 ENCOUNTER — Other Ambulatory Visit (INDEPENDENT_AMBULATORY_CARE_PROVIDER_SITE_OTHER): Payer: Self-pay | Admitting: Nurse Practitioner

## 2024-02-06 DIAGNOSIS — I83892 Varicose veins of left lower extremities with other complications: Secondary | ICD-10-CM

## 2024-02-19 ENCOUNTER — Encounter (INDEPENDENT_AMBULATORY_CARE_PROVIDER_SITE_OTHER): Admitting: Nurse Practitioner

## 2024-02-19 ENCOUNTER — Encounter (INDEPENDENT_AMBULATORY_CARE_PROVIDER_SITE_OTHER): Payer: Self-pay

## 2024-02-25 ENCOUNTER — Encounter: Payer: Self-pay | Admitting: Family Medicine

## 2024-03-23 ENCOUNTER — Encounter (INDEPENDENT_AMBULATORY_CARE_PROVIDER_SITE_OTHER): Payer: Self-pay | Admitting: Nurse Practitioner

## 2024-03-23 ENCOUNTER — Ambulatory Visit (INDEPENDENT_AMBULATORY_CARE_PROVIDER_SITE_OTHER)

## 2024-03-23 ENCOUNTER — Ambulatory Visit (INDEPENDENT_AMBULATORY_CARE_PROVIDER_SITE_OTHER): Admitting: Nurse Practitioner

## 2024-03-23 VITALS — BP 169/81 | HR 68 | Resp 18 | Ht 66.0 in | Wt 159.0 lb

## 2024-03-23 DIAGNOSIS — I83892 Varicose veins of left lower extremities with other complications: Secondary | ICD-10-CM

## 2024-03-23 NOTE — Progress Notes (Signed)
 Subjective:    Patient ID: Tasha Wallace, female    DOB: 03/05/1941, 83 y.o.   MRN: [id number] Chief Complaint  Patient presents with   Establish Care    NP LE reflux + consult. Varicose veins w/ edema    The patient is an 83 year old female who presents today for evaluation of swelling in her left lower extremity.  She notes this occurred approximately mid June.  During that time there was no open wound that was slow and difficult to heal.  She notes that with wound care and antibiotics from her primary care physician ultimately it has healed and remains healed today.  She notes that there was not any specific trauma to the area that prompted the wound.  She also notes that there was no trauma that prompted her significant edema.  She notes that she does wear medical grade compression socks although on an inconsistent basis.  At that time she underwent DVT studies which were negative.  Today the swelling has greatly improved with just some minimal swelling present today.  Today noninvasive studies show evidence of deep venous insufficiency throughout the left lower extremity.  She also has venous reflux noted in the great and small saphenous veins.  No evidence of DVT or superficial thrombophlebitis is noted today.    Review of Systems  Cardiovascular:  Positive for leg swelling.  Skin:  Negative for wound.  All other systems reviewed and are negative.      Objective:   Physical Exam Vitals reviewed.  HENT:     Head: Normocephalic.  Cardiovascular:     Rate and Rhythm: Normal rate.     Pulses: Normal pulses.  Pulmonary:     Effort: Pulmonary effort is normal.  Musculoskeletal:     Left lower leg: Edema present.  Skin:    General: Skin is warm and dry.  Neurological:     Mental Status: She is alert and oriented to person, place, and time.  Psychiatric:        Mood and Affect: Mood normal.        Behavior: Behavior normal.        Thought Content: Thought content normal.         Judgment: Judgment normal.     BP (!) 169/81 (BP Location: Left Arm, Patient Position: Sitting, Cuff Size: Normal)   Pulse 68   Resp 18   Ht 5' 6 (1.676 m)   Wt 159 lb (72.1 kg)   BMI 25.66 kg/m   Past Medical History:  Diagnosis Date   Anxiety    GERD (gastroesophageal reflux disease)    Hyperlipidemia     Social History   Socioeconomic History   Marital status: Divorced    Spouse name: Not on file   Number of children: Not on file   Years of education: Not on file   Highest education level: Not on file  Occupational History   Not on file  Tobacco Use   Smoking status: Former    Current packs/day: 0.00    Average packs/day: 1 pack/day for 35.0 years (35.0 ttl pk-yrs)    Types: Cigarettes    Start date: 06/17/1977    Quit date: 06/17/2012    Years since quitting: 11.7   Smokeless tobacco: Never  Vaping Use   Vaping status: Never Used  Substance and Sexual Activity   Alcohol use: Yes    Alcohol/week: 2.0 standard drinks of alcohol    Types: 2 Shots of liquor per  week   Drug use: No   Sexual activity: Not on file  Other Topics Concern   Not on file  Social History Narrative   Not on file   Social Drivers of Health   Financial Resource Strain: Low Risk  (12/11/2023)   Received from [facility]   Overall Financial Resource Strain (CARDIA)    Difficulty of Paying Living Expenses: Not hard at all  Food Insecurity: No Food Insecurity (12/11/2023)   Received from [facility]   Hunger Vital Sign    Within the past 12 months, you worried that your food would run out before you got the money to buy more.: Never true    Within the past 12 months, the food you bought just didn't last and you didn't have money to get more.: Never true  Transportation Needs: No Transportation Needs (12/11/2023)   Received from [facility] - Transportation    In the past 12 months, has lack of transportation kept you from  medical appointments or from getting medications?: No    Lack of Transportation (Non-Medical): No  Physical Activity: Inactive (01/28/2023)   Exercise Vital Sign    Days of Exercise per Week: 0 days    Minutes of Exercise per Session: 0 min  Stress: No Stress Concern Present (01/28/2023)   [employer] of Occupational Health - Occupational Stress Questionnaire    Feeling of Stress : Not at all  Social Connections: Moderately Isolated (01/28/2023)   Social Connection and Isolation Panel    Frequency of Communication with Friends and Family: More than three times a week    Frequency of Social Gatherings with Friends and Family: Three times a week    Attends Religious Services: More than 4 times per year    Active Member of Clubs or Organizations: No    Attends Banker Meetings: Never    Marital Status: Divorced  Catering manager Violence: Not At Risk (01/28/2023)   Humiliation, Afraid, Rape, and Kick questionnaire    Fear of Current or Ex-Partner: No    Emotionally Abused: No    Physically Abused: No    Sexually Abused: No    Past Surgical History:  Procedure Laterality Date   CATARACT EXTRACTION W/PHACO Right 12/31/2017   Procedure: CATARACT EXTRACTION PHACO AND INTRAOCULAR LENS PLACEMENT (IOC);  Surgeon: Jaye Fallow, MD;  Location: [facility] ORS;  Service: Ophthalmology;  Laterality: Right;  US  00:43.2 AP% 16.0 CDE 6.91 Fluid pack lot # [id number]   CATARACT EXTRACTION W/PHACO Left 02/04/2018   Procedure: CATARACT EXTRACTION PHACO AND INTRAOCULAR LENS PLACEMENT (IOC);  Surgeon: Jaye Fallow, MD;  Location: [facility] ORS;  Service: Ophthalmology;  Laterality: Left;  US  00:46.6 AP% 17.6 CDE 8.19 Fluid Pack lot # [id number]   colonoscopy     GANGLION CYST EXCISION  1986   IRRIGATION AND DEBRIDEMENT SEBACEOUS CYST  1990   TONSILLECTOMY AND ADENOIDECTOMY  1950   URETHRA SURGERY  1971    Family History  Problem Relation Age of Onset   Hypertension Mother    CVA  Mother    Heart disease Father     Allergies  Allergen Reactions   Metronidazole Rash       Latest Ref Rng & Units 05/20/2020   11:22 AM 05/26/2019   11:15 AM 11/20/2017    9:58 AM  CBC  WBC 3.4 - 10.8 x10E3/uL 7.1  5.4  4.5   Hemoglobin 11.1 - 15.9 g/dL 86.8  12.5  12.1   Hematocrit 34.0 - 46.6 % 39.4  38.5  36.6   Platelets 150 - 450 x10E3/uL 271  262  253       CMP     Component Value Date/Time   NA 140 02/14/2023 1531   K 4.6 02/14/2023 1531   CL 104 02/14/2023 1531   CO2 24 02/14/2023 1531   GLUCOSE 86 02/14/2023 1531   BUN 18 02/14/2023 1531   CREATININE 0.88 02/14/2023 1531   CALCIUM  10.0 02/14/2023 1531   PROT 7.0 02/14/2023 1531   ALBUMIN 4.3 02/14/2023 1531   AST 21 02/14/2023 1531   ALT 19 02/14/2023 1531   ALKPHOS 88 02/14/2023 1531   BILITOT 0.3 02/14/2023 1531   EGFR 66 02/14/2023 1531   GFRNONAA 69 05/20/2020 1122     No results found.     Assessment & Plan:   1. Varicose veins of left leg with edema (Primary) Today patient's noninvasive studies show that she does have significant venous insufficiency as well as venous reflux in her left lower extremity.  We discussed multiple treatment options including conservative therapy including use of medical grade compression stockings.  These should be 20 to 30 mmHg, and then recommended to be worn consistently, preferably put on first thing in the morning and taken off before bedtime.  The patient is also advised to elevate her lower extremities and to be active to help with control swelling.  We also discussed interventional procedures to help with treatment of the great saphenous vein.  We discussed an endovenous laser ablation.  However in that discussion we also noted that because she does have significant deep venous insufficiency and endovenous laser ablation may not completely stop her swelling from occurring.  Her best option for treatment would be to adhere to conservative therapy.  Given that she also  does not have any open wounds or ulcerations I think this is certainly reasonable plan.  She is advised to follow-up with us  if she begins to have any open wounds or ulcerations or swelling becomes very difficult to control.  Otherwise we will see her on an as-needed basis.   No current outpatient medications on file prior to visit.   No current facility-administered medications on file prior to visit.    There are no Patient Instructions on file for this visit. No follow-ups on file.   Shanquita Ronning E Darneshia Demary, NP
